# Patient Record
Sex: Female | Born: 1980 | Race: White | Hispanic: No | Marital: Married | Smoking: Current every day smoker
Health system: Southern US, Community
[De-identification: ages and names within clinical notes are randomized; demographics above are authoritative.]

## PROBLEM LIST (undated history)

## (undated) DIAGNOSIS — Z227 Latent tuberculosis: Secondary | ICD-10-CM

## (undated) DIAGNOSIS — K219 Gastro-esophageal reflux disease without esophagitis: Secondary | ICD-10-CM

## (undated) DIAGNOSIS — O009 Unspecified ectopic pregnancy without intrauterine pregnancy: Secondary | ICD-10-CM

## (undated) DIAGNOSIS — E785 Hyperlipidemia, unspecified: Secondary | ICD-10-CM

## (undated) DIAGNOSIS — Z9289 Personal history of other medical treatment: Secondary | ICD-10-CM

## (undated) DIAGNOSIS — Z720 Tobacco use: Secondary | ICD-10-CM

## (undated) DIAGNOSIS — L409 Psoriasis, unspecified: Secondary | ICD-10-CM

## (undated) DIAGNOSIS — I2510 Atherosclerotic heart disease of native coronary artery without angina pectoris: Secondary | ICD-10-CM

## (undated) HISTORY — DX: Hyperlipidemia, unspecified: E78.5

## (undated) HISTORY — DX: Latent tuberculosis: Z22.7

## (undated) HISTORY — DX: Psoriasis, unspecified: L40.9

## (undated) HISTORY — PX: DG GALL BLADDER: HXRAD326

## (undated) HISTORY — PX: CHOLECYSTECTOMY: SHX55

## (undated) HISTORY — DX: Atherosclerotic heart disease of native coronary artery without angina pectoris: I25.10

## (undated) HISTORY — DX: Personal history of other medical treatment: Z92.89

## (undated) HISTORY — PX: DILATION AND CURETTAGE OF UTERUS: SHX78

## (undated) HISTORY — DX: Tobacco use: Z72.0

---

## 2004-06-27 ENCOUNTER — Observation Stay: Payer: Self-pay

## 2004-10-01 ENCOUNTER — Observation Stay: Payer: Self-pay | Admitting: Obstetrics & Gynecology

## 2004-10-07 ENCOUNTER — Inpatient Hospital Stay: Payer: Self-pay | Admitting: Obstetrics & Gynecology

## 2005-04-22 ENCOUNTER — Ambulatory Visit: Payer: Self-pay | Admitting: Family Medicine

## 2005-05-02 ENCOUNTER — Inpatient Hospital Stay: Payer: Self-pay | Admitting: Internal Medicine

## 2006-01-28 ENCOUNTER — Emergency Department: Payer: Self-pay | Admitting: Internal Medicine

## 2006-01-30 ENCOUNTER — Inpatient Hospital Stay: Payer: Self-pay | Admitting: Internal Medicine

## 2006-03-06 ENCOUNTER — Ambulatory Visit: Payer: Self-pay | Admitting: Surgery

## 2008-12-29 ENCOUNTER — Ambulatory Visit: Payer: Self-pay | Admitting: Emergency Medicine

## 2010-02-26 ENCOUNTER — Ambulatory Visit: Payer: Self-pay | Admitting: Obstetrics & Gynecology

## 2010-03-02 ENCOUNTER — Ambulatory Visit: Payer: Self-pay | Admitting: Obstetrics & Gynecology

## 2011-06-02 ENCOUNTER — Ambulatory Visit: Payer: Self-pay | Admitting: Obstetrics & Gynecology

## 2012-11-08 ENCOUNTER — Ambulatory Visit: Payer: Self-pay

## 2015-08-14 ENCOUNTER — Encounter: Payer: Self-pay | Admitting: Family Medicine

## 2015-08-14 ENCOUNTER — Ambulatory Visit (INDEPENDENT_AMBULATORY_CARE_PROVIDER_SITE_OTHER): Payer: BC Managed Care – PPO | Admitting: Family Medicine

## 2015-08-14 VITALS — BP 110/78 | HR 104 | Temp 98.1°F | Resp 16 | Ht 65.0 in | Wt 177.0 lb

## 2015-08-14 DIAGNOSIS — J0100 Acute maxillary sinusitis, unspecified: Secondary | ICD-10-CM | POA: Diagnosis not present

## 2015-08-14 DIAGNOSIS — J40 Bronchitis, not specified as acute or chronic: Secondary | ICD-10-CM | POA: Diagnosis not present

## 2015-08-14 MED ORDER — AMOXICILLIN 500 MG PO CAPS: 500.0000 mg | ORAL_CAPSULE | Freq: Three times a day (TID) | ORAL | Status: DC

## 2015-08-14 MED ORDER — GUAIFENESIN-CODEINE 100-10 MG/5ML PO SYRP: 5.0000 mL | ORAL_SOLUTION | Freq: Three times a day (TID) | ORAL | Status: DC | PRN

## 2015-08-14 NOTE — Progress Notes (Signed)
Name: Morgan Mcdowell   MRN: [id number]    DOB: September 26, 1980   Date:08/14/2015       Progress Note  Subjective  Chief Complaint  Chief Complaint  Patient presents with  . Sinus Problem    coughing and sinus pain in head- no fever     Sinus Problem This is a new problem. The current episode started yesterday. The problem has been gradually worsening since onset. There has been no fever. She is experiencing no pain. Associated symptoms include congestion, coughing, shortness of breath, sinus pressure and a sore throat. Pertinent negatives include no chills, diaphoresis, ear pain, headaches, hoarse voice, neck pain or sneezing. Past treatments include acetaminophen. The treatment provided mild relief.  Cough The current episode started yesterday. The problem has been gradually worsening. The cough is productive of purulent sputum (yellow/brown). Associated symptoms include a sore throat and shortness of breath. Pertinent negatives include no chest pain, chills, ear pain, fever, headaches, heartburn, myalgias, rash, weight loss or wheezing. The symptoms are aggravated by pollens. She has tried OTC cough suppressant for the symptoms. The treatment provided mild relief. There is no history of environmental allergies.    No problem-specific assessment & plan notes found for this encounter.   History reviewed. No pertinent past medical history.  Past Surgical History  Procedure Laterality Date  . Dg gall bladder      Family History  Problem Relation Age of Onset  . Family history unknown: Yes    Social History   Social History  . Marital Status: Married    Spouse Name: N/A  . Number of Children: N/A  . Years of Education: N/A   Occupational History  . Not on file.   Social History Main Topics  . Smoking status: Current Every Day Smoker -- 0.50 packs/day    Types: Cigarettes  . Smokeless tobacco: Never Used  . Alcohol Use: No  . Drug Use: No  . Sexual Activity: Not on file    Other Topics Concern  . Not on file   Social History Narrative  . No narrative on file    No Known Allergies   Review of Systems  Constitutional: Negative for fever, chills, weight loss, malaise/fatigue and diaphoresis.  HENT: Positive for congestion, sinus pressure and sore throat. Negative for ear discharge, ear pain, hoarse voice and sneezing.   Eyes: Negative for blurred vision.  Respiratory: Positive for cough and shortness of breath. Negative for sputum production and wheezing.   Cardiovascular: Negative for chest pain, palpitations and leg swelling.  Gastrointestinal: Negative for heartburn, nausea, abdominal pain, diarrhea, constipation, blood in stool and melena.  Genitourinary: Negative for dysuria, urgency, frequency and hematuria.  Musculoskeletal: Negative for myalgias, back pain, joint pain and neck pain.  Skin: Negative for rash.  Neurological: Negative for dizziness, tingling, sensory change, focal weakness and headaches.  Endo/Heme/Allergies: Negative for environmental allergies and polydipsia. Does not bruise/bleed easily.  Psychiatric/Behavioral: Negative for depression and suicidal ideas. The patient is not nervous/anxious and does not have insomnia.      Objective  Filed Vitals:   08/14/15 1402  BP: 110/78  Pulse: 104  Temp: 98.1 F (36.7 C)  TempSrc: Oral  Resp: 16  Height:  (1.651 m)  Weight: 177 lb (80.287 kg)  SpO2: 98%    Physical Exam  Constitutional: She is well-developed, well-nourished, and in no distress. No distress.  HENT:  Head: Normocephalic and atraumatic.  Right Ear: Tympanic membrane, external ear and ear canal normal.  Left Ear: Tympanic membrane, external ear and ear canal normal.  Nose: Right sinus exhibits maxillary sinus tenderness. Left sinus exhibits maxillary sinus tenderness.  Mouth/Throat: Oropharynx is clear and moist.  Eyes: Conjunctivae and EOM are normal. Pupils are equal, round, and reactive to light. Right  eye exhibits no discharge. Left eye exhibits no discharge.  Neck: Normal range of motion. Neck supple. No JVD present. No thyromegaly present.  Cardiovascular: Normal rate, regular rhythm, normal heart sounds and intact distal pulses.  Exam reveals no gallop and no friction rub.   No murmur heard. Pulmonary/Chest: Effort normal and breath sounds normal.  Abdominal: Soft. Bowel sounds are normal. She exhibits no mass. There is no tenderness. There is no guarding.  Musculoskeletal: Normal range of motion. She exhibits no edema.  Lymphadenopathy:    She has no cervical adenopathy.  Neurological: She is alert. She has normal reflexes.  Skin: Skin is warm and dry. She is not diaphoretic.  Psychiatric: Mood and affect normal.  Nursing note and vitals reviewed.     Assessment & Plan  Problem List Items Addressed This Visit    None    Visit Diagnoses    Acute maxillary sinusitis, recurrence not specified    -  Primary    Relevant Medications    amoxicillin (AMOXIL) 500 MG capsule    guaiFENesin-codeine (ROBITUSSIN AC) 100-10 MG/5ML syrup    Bronchitis        Relevant Medications    amoxicillin (AMOXIL) 500 MG capsule    guaiFENesin-codeine (ROBITUSSIN AC) 100-10 MG/5ML syrup         Dr. Hayden Rasmusseneanna Jackee Glasner [facility]  08/14/2015

## 2015-11-23 ENCOUNTER — Emergency Department
Admission: EM | Admit: 2015-11-23 | Discharge: 2015-11-24 | Disposition: A | Discharge status: Home / Self Care | Payer: BC Managed Care – PPO | Attending: Emergency Medicine | Admitting: Emergency Medicine

## 2015-11-23 ENCOUNTER — Emergency Department: Payer: BC Managed Care – PPO

## 2015-11-23 ENCOUNTER — Encounter: Payer: Self-pay | Admitting: Emergency Medicine

## 2015-11-23 DIAGNOSIS — N938 Other specified abnormal uterine and vaginal bleeding: Secondary | ICD-10-CM | POA: Diagnosis not present

## 2015-11-23 DIAGNOSIS — F17210 Nicotine dependence, cigarettes, uncomplicated: Secondary | ICD-10-CM | POA: Insufficient documentation

## 2015-11-23 DIAGNOSIS — Z3A10 10 weeks gestation of pregnancy: Secondary | ICD-10-CM | POA: Diagnosis not present

## 2015-11-23 DIAGNOSIS — O99331 Smoking (tobacco) complicating pregnancy, first trimester: Secondary | ICD-10-CM | POA: Insufficient documentation

## 2015-11-23 DIAGNOSIS — R1032 Left lower quadrant pain: Secondary | ICD-10-CM | POA: Insufficient documentation

## 2015-11-23 DIAGNOSIS — O26899 Other specified pregnancy related conditions, unspecified trimester: Secondary | ICD-10-CM

## 2015-11-23 DIAGNOSIS — R102 Pelvic and perineal pain: Secondary | ICD-10-CM | POA: Insufficient documentation

## 2015-11-23 DIAGNOSIS — R109 Unspecified abdominal pain: Secondary | ICD-10-CM

## 2015-11-23 DIAGNOSIS — O26891 Other specified pregnancy related conditions, first trimester: Secondary | ICD-10-CM | POA: Diagnosis not present

## 2015-11-23 HISTORY — DX: Unspecified ectopic pregnancy without intrauterine pregnancy: O00.90

## 2015-11-23 LAB — URINALYSIS COMPLETE WITH MICROSCOPIC (ARMC ONLY)
Bacteria, UA: NONE SEEN
Bilirubin Urine: NEGATIVE
GLUCOSE, UA: NEGATIVE mg/dL
Ketones, ur: NEGATIVE mg/dL
LEUKOCYTES UA: NEGATIVE
NITRITE: NEGATIVE
PROTEIN: NEGATIVE mg/dL
SPECIFIC GRAVITY, URINE: 1.003 — AB (ref 1.005–1.030)
pH: 6 (ref 5.0–8.0)

## 2015-11-23 LAB — COMPREHENSIVE METABOLIC PANEL
ALK PHOS: 79 U/L (ref 38–126)
ALT: 27 U/L (ref 14–54)
ANION GAP: 12 (ref 5–15)
AST: 23 U/L (ref 15–41)
Albumin: 4.2 g/dL (ref 3.5–5.0)
BILIRUBIN TOTAL: 0.4 mg/dL (ref 0.3–1.2)
BUN: 6 mg/dL (ref 6–20)
CALCIUM: 9 mg/dL (ref 8.9–10.3)
CO2: 21 mmol/L — ABNORMAL LOW (ref 22–32)
Chloride: 102 mmol/L (ref 101–111)
Creatinine, Ser: 0.5 mg/dL (ref 0.44–1.00)
GFR calc non Af Amer: >60 mL/min (ref >60)
Glucose, Bld: 108 mg/dL — ABNORMAL HIGH (ref 65–99)
Potassium: 3.3 mmol/L — ABNORMAL LOW (ref 3.5–5.1)
Sodium: 135 mmol/L (ref 135–145)
TOTAL PROTEIN: 7.9 g/dL (ref 6.5–8.1)

## 2015-11-23 LAB — CBC
HCT: 38.1 % (ref 35.0–47.0)
HEMOGLOBIN: 13.1 g/dL (ref 12.0–16.0)
MCH: 30.6 pg (ref 26.0–34.0)
MCHC: 34.4 g/dL (ref 32.0–36.0)
MCV: 88.9 fL (ref 80.0–100.0)
Platelets: 330 10*3/uL (ref 150–440)
RBC: 4.29 MIL/uL (ref 3.80–5.20)
RDW: 13.7 % (ref 11.5–14.5)
WBC: 16.5 10*3/uL — AB (ref 3.6–11.0)

## 2015-11-23 LAB — ABO/RH: ABO/RH(D): A POS

## 2015-11-23 LAB — LIPASE, BLOOD: Lipase: 21 U/L (ref 11–51)

## 2015-11-23 LAB — HCG, QUANTITATIVE, PREGNANCY: HCG, BETA CHAIN, QUANT, S: 761 m[IU]/mL — AB (ref <5)

## 2015-11-23 NOTE — ED Notes (Addendum)
Pt c/o LEFT groin pain, reports has hx of tubal pregnancy states "it feels the same." Pt reports pain has been progressively getting worse over the past week. Pt denies nausea, vomiting, or diarrhea. MD at bedside.

## 2015-11-23 NOTE — ED Triage Notes (Signed)
Patient reports left lower quad pain and some light vaginal bleeding times one week that became worse today. Patient reports that she took a home pregnancy test and it was positive. Patient reports she has a history of a ectopic pregnancy and was concerned she may have another one.

## 2015-11-23 NOTE — ED Notes (Signed)
Pt in ultrasound

## 2015-11-23 NOTE — Discharge Instructions (Signed)
As I explained to you your pregnancy is too early and we cannot fully rule out an ectopic pregnancy. If your abdominal pain gets worse, if you feel like you are going to pass out, you need to return to the emergency department immediately for reevaluation. Otherwise you MUST follow-up in 48 hours for repeat lab work to determine if this is a viable pregnancy or an ectopic pregnancy. You can follow up with your doctor, [place] [facility], or with the [facility] clinic that [facility] referring you. If you're unable to get in with any of these clinics you can return to the emergency department for this evaluation.

## 2015-11-23 NOTE — ED Provider Notes (Signed)
[facility] Emergency Department Provider Note  ____________________________________________  Time seen: Approximately 11:10 PM  I have reviewed the triage vital signs and the nursing notes.   HISTORY  Chief Complaint Abdominal Pain and Vaginal Bleeding   HPI Morgan Mcdowell is a 35 y.o. female the history of a prior ectopic pregnancy who presents for evaluation of left abdominal pain. Patient reports a week of dull pain in the left lower quadrant but today became sharp and more intense. She currently endorses moderate pain, constant, nonradiating, located in the left lower quadrant. She took a pregnancy test this morning was positive and she was concerned she had another ectopic pregnancy. Patient's first ectopic was treated with methotrexate successfully. Patient denies any vaginal discharge, urinary symptoms, nausea, vomiting, chest pain, shortness of breath. She does not take any birth control. LMP in the middle of May.  Past Medical History:  Diagnosis Date  . Ectopic pregnancy     There are no active problems to display for this patient.   Past Surgical History:  Procedure Laterality Date  . DG GALL BLADDER      Prior to Admission medications   Medication Sig Start Date End Date Taking? Authorizing Provider  amoxicillin (AMOXIL) 500 MG capsule Take 1 capsule (500 mg total) by mouth 3 (three) times daily. 08/14/15   Duanne Limerick, MD  guaiFENesin-codeine (ROBITUSSIN AC) 100-10 MG/5ML syrup Take 5 mLs by mouth 3 (three) times daily as needed for cough. 08/14/15   Duanne Limerick, MD    Allergies Review of patient's allergies indicates no known allergies.  Family History  Problem Relation Age of Onset  . Family history unknown: Yes    Social History Social History  Substance Use Topics  . Smoking status: Current Every Day Smoker    Packs/day: 0.50    Types: Cigarettes  . Smokeless tobacco: Never Used  . Alcohol use No    Review of  Systems  Constitutional: Negative for fever. Eyes: Negative for visual changes. ENT: Negative for sore throat. Cardiovascular: Negative for chest pain. Respiratory: Negative for shortness of breath. Gastrointestinal: LLQ abdominal pain. No vomiting or diarrhea. Genitourinary: Negative for dysuria. Musculoskeletal: Negative for back pain. Skin: Negative for rash. Neurological: Negative for headaches, weakness or numbness.  ____________________________________________   PHYSICAL EXAM:  VITAL SIGNS: ED Triage Vitals [11/23/15 2050]  Enc Vitals Group     BP 123/74     Pulse Rate 95     Resp 18     Temp 98.5 F (36.9 C)     Temp Source Oral     SpO2 100 %     Weight 170 lb (77.1 kg)     Height  (1.626 m)     Head Circumference      Peak Flow      Pain Score 6     Pain Loc      Pain Edu?      Excl. in GC?     Constitutional: Alert and oriented. Well appearing and in no apparent distress. HEENT:      Head: Normocephalic and atraumatic.         Eyes: Conjunctivae are normal. Sclera is non-icteric. EOMI. PERRL      Mouth/Throat: Mucous membranes are moist.       Neck: Supple with no signs of meningismus. Cardiovascular: Regular rate and rhythm. No murmurs, gallops, or rubs. 2+ symmetrical distal pulses are present in all extremities. No JVD. Respiratory: Normal respiratory effort.  Lungs are clear to auscultation bilaterally. No wheezes, crackles, or rhonchi.  Gastrointestinal: Soft, ttp over the LLQ, and non distended with positive bowel sounds. No rebound or guarding. Genitourinary: No CVA tenderness. Pelvic exam: Normal external genitalia, no rashes or lesions. Normal cervical mucus. Os closed. No cervical motion tenderness.  Left adnexal tenderness to palpation.   Musculoskeletal: Nontender with normal range of motion in all extremities. No edema, cyanosis, or erythema of extremities. Neurologic: Normal speech and language. Face is symmetric. Moving all extremities.  No gross focal neurologic deficits are appreciated. Skin: Skin is warm, dry and intact. No rash noted. Psychiatric: Mood and affect are normal. Speech and behavior are normal.  ____________________________________________   LABS (all labs ordered are listed, but only abnormal results are displayed)  Labs Reviewed  COMPREHENSIVE METABOLIC PANEL - Abnormal; Notable for the following:       Result Value   Potassium 3.3 (*)    CO2 21 (*)    Glucose, Bld 108 (*)    All other components within normal limits  CBC - Abnormal; Notable for the following:    WBC 16.5 (*)    All other components within normal limits  URINALYSIS COMPLETEWITH MICROSCOPIC ([facility] ONLY) - Abnormal; Notable for the following:    Color, Urine STRAW (*)    APPearance CLEAR (*)    Specific Gravity, Urine 1.003 (*)    Hgb urine dipstick 2+ (*)    Squamous Epithelial / LPF 0-5 (*)    All other components within normal limits  HCG, QUANTITATIVE, PREGNANCY - Abnormal; Notable for the following:    hCG, Beta Chain, Quant, S 761 (*)    All other components within normal limits  LIPASE, BLOOD  ABO/RH   ____________________________________________  EKG  none ____________________________________________  RADIOLOGY  TVUS: negative  ____________________________________________   PROCEDURES  Procedure(s) performed: None Procedures Critical Care performed:  None ____________________________________________   INITIAL IMPRESSION / ASSESSMENT AND PLAN / ED COURSE   35 y.o. female the history of a prior ectopic pregnancy who presents for evaluation of left abdominal pain and a positive pregnancy test. Patient hCG is less than 800. Transvaginal ultrasound does not show an IUP or an ectopic pregnancy. She is tender over her left adnexa but does not have a surgical abdomen. Plan is for recheck hCG in 48 hours. I explained to the patient that there is a chance that this could be another ectopic pregnancy and if the pain  is getting worse or she feels that she is going to pass out she needs to return sooner. She will try to follow up with her OB but I am also referring her to the [facility] and our OB on-call. I instructed the patient to return to the emergency department she is unable to get in with any of these providers for further evaluation. Patient is comfortable with this plan and agrees to return. Her wet prep is pending at this time. Care is transferred to Dr. Zenda Alpers.  Pertinent labs & imaging results that were available during my care of the patient were reviewed by me and considered in my medical decision making (see chart for details).    ____________________________________________   FINAL CLINICAL IMPRESSION(S) / ED DIAGNOSES  Final diagnoses:  Pelvic pain in female  Abdominal pain affecting pregnancy      NEW MEDICATIONS STARTED DURING THIS VISIT:  New Prescriptions   No medications on file     Note:  This document was prepared using Dragon voice recognition  software and may include unintentional dictation errors.    Nita Sickle, MD 11/23/15 [phone]

## 2015-11-24 LAB — WET PREP, GENITAL
CLUE CELLS WET PREP: NONE SEEN
Sperm: NONE SEEN
Trich, Wet Prep: NONE SEEN
Yeast Wet Prep HPF POC: NONE SEEN

## 2015-11-24 LAB — CHLAMYDIA/NGC RT PCR (ARMC ONLY)
CHLAMYDIA TR: NOT DETECTED
N gonorrhoeae: NOT DETECTED

## 2015-11-24 NOTE — ED Notes (Signed)

## 2015-11-24 NOTE — ED Provider Notes (Signed)
-----------------------------------------   1:46 AM on 11/24/2015 -----------------------------------------   Blood pressure 120/76, pulse 89, temperature 98.5 F (36.9 C), temperature source Oral, resp. rate 16, height 5\' 4"  (1.626 m), weight 170 lb (77.1 kg), SpO2 99 %.  Assuming care from Dr. Darden Dates.  In short, Morgan Mcdowell is a 35 y.o. female with a chief complaint of Abdominal Pain and Vaginal Bleeding .  Refer to the original H&P for additional details.  The current plan of care is to follow up the result of the wet prep and the ABO/Rh.  The patient's wet prep is negative and the patient's blood type is A+  Since the ultrasound was negative and her wet work is negative the patient will be discharged to home. The patient has been encouraged to return in 2 days or follow-up with her doctor in 2 days for repeat blood work and ultrasound. The patient is also encouraged to return of her pain seems to worsen or her bleeding worsens.    Rebecka Apley, MD 11/24/15 [phone]

## 2015-12-10 ENCOUNTER — Encounter
Admission: RE | Admit: 2015-12-10 | Discharge: 2015-12-10 | Disposition: A | Discharge status: Home / Self Care | Payer: BC Managed Care – PPO | Source: Ambulatory Visit | Attending: Obstetrics and Gynecology | Admitting: Obstetrics and Gynecology

## 2015-12-10 HISTORY — DX: Gastro-esophageal reflux disease without esophagitis: K21.9

## 2015-12-10 NOTE — Patient Instructions (Signed)
  Your procedure is scheduled on: 12-11-15 Report to Same Day Surgery 2nd floor medical mall To find out your arrival time please call [phone] between 1PM - 3PM on 12-10-15  Remember: Instructions that are not followed completely may result in serious medical risk, up to and including death, or upon the discretion of your surgeon and anesthesiologist your surgery may need to be rescheduled.    _x___ 1. Do not eat food or drink liquids after midnight. No gum chewing or hard candies.     __x__ 2. No Alcohol for 24 hours before or after surgery.   __x__3. No Smoking for 24 prior to surgery.   ____  4. Bring all medications with you on the day of surgery if instructed.    __x__ 5. Notify your doctor if there is any change in your medical condition     (cold, fever, infections).     Do not wear jewelry, make-up, hairpins, clips or nail polish.  Do not wear lotions, powders, or perfumes. You may wear deodorant.  Do not shave 48 hours prior to surgery. Men may shave face and neck.  Do not bring valuables to the hospital.    [facility] belongings or valuables.               Contacts, dentures or bridgework may not be worn into surgery.  Leave your suitcase in the car. After surgery it may be brought to your room.  For patients admitted to the hospital, discharge time is determined by your treatment team.   Patients discharged the day of surgery will not be allowed to drive home.    Please read over the following fact sheets that you were given:   [facility] Preparing for [facility] and or MRSA Information   ____ Take these medicines the morning of surgery with A SIP OF WATER:    1. NONE  2.  3.  4.  5.  6.  ____ Fleet Enema (as directed)   ____ Use CHG Soap or sage wipes as directed on instruction sheet   ____ Use inhalers on the day of surgery and bring to hospital day of surgery  ____ Stop metformin 2 days prior to surgery    ____ Take 1/2 of  usual insulin dose the night before surgery and none on the morning of surgery.   ____ Stop aspirin or coumadin, or plavix  _x__ Stop Anti-inflammatories such as Advil, Aleve, Ibuprofen, Motrin, Naproxen,          Naprosyn, Goodies powders or aspirin products. Ok to take Tylenol.   ____ Stop supplements until after surgery.    ____ Bring C-Pap to the hospital.

## 2015-12-11 ENCOUNTER — Encounter: Payer: Self-pay | Admitting: *Deleted

## 2015-12-11 ENCOUNTER — Ambulatory Visit: Payer: BC Managed Care – PPO | Admitting: Anesthesiology

## 2015-12-11 ENCOUNTER — Ambulatory Visit
Admission: RE | Admit: 2015-12-11 | Discharge: 2015-12-11 | Disposition: A | Discharge status: Home / Self Care | Payer: BC Managed Care – PPO | Source: Ambulatory Visit | Attending: Obstetrics and Gynecology | Admitting: Obstetrics and Gynecology

## 2015-12-11 ENCOUNTER — Encounter
Admission: RE | Disposition: A | Discharge status: Home / Self Care | Payer: Self-pay | Source: Ambulatory Visit | Attending: Obstetrics and Gynecology

## 2015-12-11 DIAGNOSIS — Z9889 Other specified postprocedural states: Secondary | ICD-10-CM | POA: Diagnosis not present

## 2015-12-11 DIAGNOSIS — O001 Tubal pregnancy without intrauterine pregnancy: Secondary | ICD-10-CM | POA: Insufficient documentation

## 2015-12-11 DIAGNOSIS — O26899 Other specified pregnancy related conditions, unspecified trimester: Secondary | ICD-10-CM | POA: Insufficient documentation

## 2015-12-11 DIAGNOSIS — K219 Gastro-esophageal reflux disease without esophagitis: Secondary | ICD-10-CM | POA: Diagnosis not present

## 2015-12-11 DIAGNOSIS — Z9049 Acquired absence of other specified parts of digestive tract: Secondary | ICD-10-CM | POA: Diagnosis not present

## 2015-12-11 DIAGNOSIS — O99330 Smoking (tobacco) complicating pregnancy, unspecified trimester: Secondary | ICD-10-CM | POA: Diagnosis not present

## 2015-12-11 DIAGNOSIS — N838 Other noninflammatory disorders of ovary, fallopian tube and broad ligament: Secondary | ICD-10-CM | POA: Diagnosis not present

## 2015-12-11 DIAGNOSIS — O00109 Unspecified tubal pregnancy without intrauterine pregnancy: Secondary | ICD-10-CM | POA: Diagnosis present

## 2015-12-11 DIAGNOSIS — Z302 Encounter for sterilization: Secondary | ICD-10-CM

## 2015-12-11 DIAGNOSIS — O009 Unspecified ectopic pregnancy without intrauterine pregnancy: Secondary | ICD-10-CM | POA: Diagnosis present

## 2015-12-11 HISTORY — PX: DIAGNOSTIC LAPAROSCOPY WITH REMOVAL OF ECTOPIC PREGNANCY: SHX6449

## 2015-12-11 HISTORY — PX: LAPAROSCOPIC BILATERAL SALPINGECTOMY: SHX5889

## 2015-12-11 LAB — CBC
HCT: 36.7 % (ref 35.0–47.0)
HEMOGLOBIN: 12.6 g/dL (ref 12.0–16.0)
MCH: 30.9 pg (ref 26.0–34.0)
MCHC: 34.4 g/dL (ref 32.0–36.0)
MCV: 89.7 fL (ref 80.0–100.0)
PLATELETS: 344 10*3/uL (ref 150–440)
RBC: 4.09 MIL/uL (ref 3.80–5.20)
RDW: 14 % (ref 11.5–14.5)
WBC: 11.6 10*3/uL — AB (ref 3.6–11.0)

## 2015-12-11 LAB — COMPREHENSIVE METABOLIC PANEL
ALK PHOS: 75 U/L (ref 38–126)
ALT: 19 U/L (ref 14–54)
AST: 17 U/L (ref 15–41)
Albumin: 4.1 g/dL (ref 3.5–5.0)
Anion gap: 5 (ref 5–15)
BUN: 8 mg/dL (ref 6–20)
CALCIUM: 9 mg/dL (ref 8.9–10.3)
CHLORIDE: 109 mmol/L (ref 101–111)
CO2: 24 mmol/L (ref 22–32)
CREATININE: 0.44 mg/dL (ref 0.44–1.00)
GFR calc Af Amer: >60 mL/min (ref >60)
Glucose, Bld: 95 mg/dL (ref 65–99)
Potassium: 3.8 mmol/L (ref 3.5–5.1)
SODIUM: 138 mmol/L (ref 135–145)
Total Bilirubin: 0.4 mg/dL (ref 0.3–1.2)
Total Protein: 7.7 g/dL (ref 6.5–8.1)

## 2015-12-11 LAB — TYPE AND SCREEN
ABO/RH(D): A POS
Antibody Screen: NEGATIVE

## 2015-12-11 LAB — HCG, QUANTITATIVE, PREGNANCY: hCG, Beta Chain, Quant, S: 2317 m[IU]/mL — ABNORMAL HIGH (ref <5)

## 2015-12-11 SURGERY — SALPINGECTOMY, BILATERAL, LAPAROSCOPIC
Anesthesia: General | Wound class: Clean Contaminated

## 2015-12-11 MED ORDER — LACTATED RINGERS IV SOLN
INTRAVENOUS | Status: DC
  Administered 2015-12-11: 125 mL/h via INTRAVENOUS

## 2015-12-11 MED ORDER — FAMOTIDINE 20 MG PO TABS
20.0000 mg | ORAL_TABLET | Freq: Once | ORAL | Status: AC
  Administered 2015-12-11: 20 mg via ORAL

## 2015-12-11 MED ORDER — GLYCOPYRROLATE 0.2 MG/ML IJ SOLN
INTRAMUSCULAR | Status: DC | PRN
  Administered 2015-12-11: 0.2 mg via INTRAVENOUS

## 2015-12-11 MED ORDER — ROCURONIUM BROMIDE 100 MG/10ML IV SOLN
INTRAVENOUS | Status: DC | PRN
  Administered 2015-12-11: 15 mg via INTRAVENOUS
  Administered 2015-12-11: 5 mg via INTRAVENOUS

## 2015-12-11 MED ORDER — ONDANSETRON HCL 4 MG/2ML IJ SOLN: 4.0000 mg | Freq: Once | INTRAMUSCULAR | Status: DC | PRN

## 2015-12-11 MED ORDER — DEXAMETHASONE SODIUM PHOSPHATE 10 MG/ML IJ SOLN
INTRAMUSCULAR | Status: DC | PRN
  Administered 2015-12-11: 10 mg via INTRAVENOUS

## 2015-12-11 MED ORDER — PHENYLEPHRINE HCL 10 MG/ML IJ SOLN
INTRAMUSCULAR | Status: DC | PRN
  Administered 2015-12-11 (×4): 100 ug via INTRAVENOUS

## 2015-12-11 MED ORDER — BUPIVACAINE HCL (PF) 0.5 % IJ SOLN
INTRAMUSCULAR | Status: AC
  Filled 2015-12-11: qty 30

## 2015-12-11 MED ORDER — OXYCODONE-ACETAMINOPHEN 5-325 MG PO TABS: 1.0000 | ORAL_TABLET | Freq: Four times a day (QID) | ORAL | 0 refills | Status: DC | PRN

## 2015-12-11 MED ORDER — IBUPROFEN 600 MG PO TABS: 600.0000 mg | ORAL_TABLET | Freq: Four times a day (QID) | ORAL | 0 refills | Status: DC | PRN

## 2015-12-11 MED ORDER — PROPOFOL 10 MG/ML IV BOLUS
INTRAVENOUS | Status: DC | PRN
  Administered 2015-12-11: 150 mg via INTRAVENOUS

## 2015-12-11 MED ORDER — MIDAZOLAM HCL 2 MG/2ML IJ SOLN
INTRAMUSCULAR | Status: DC | PRN
  Administered 2015-12-11: 2 mg via INTRAVENOUS

## 2015-12-11 MED ORDER — ACETAMINOPHEN 10 MG/ML IV SOLN
INTRAVENOUS | Status: DC | PRN
  Administered 2015-12-11: 1000 mg via INTRAVENOUS

## 2015-12-11 MED ORDER — LACTATED RINGERS IV SOLN: INTRAVENOUS | Status: DC

## 2015-12-11 MED ORDER — OXYCODONE-ACETAMINOPHEN 5-325 MG PO TABS
ORAL_TABLET | ORAL | Status: AC
  Administered 2015-12-11: 1 via ORAL
  Filled 2015-12-11: qty 1

## 2015-12-11 MED ORDER — SUCCINYLCHOLINE CHLORIDE 20 MG/ML IJ SOLN
INTRAMUSCULAR | Status: DC | PRN
  Administered 2015-12-11: 120 mg via INTRAVENOUS

## 2015-12-11 MED ORDER — KETOROLAC TROMETHAMINE 30 MG/ML IJ SOLN
INTRAMUSCULAR | Status: DC | PRN
  Administered 2015-12-11: 30 mg via INTRAVENOUS

## 2015-12-11 MED ORDER — FENTANYL CITRATE (PF) 100 MCG/2ML IJ SOLN
INTRAMUSCULAR | Status: DC | PRN
  Administered 2015-12-11 (×2): 50 ug via INTRAVENOUS

## 2015-12-11 MED ORDER — FAMOTIDINE 20 MG PO TABS
ORAL_TABLET | ORAL | Status: AC
  Administered 2015-12-11: 20 mg via ORAL
  Filled 2015-12-11: qty 1

## 2015-12-11 MED ORDER — SUGAMMADEX SODIUM 200 MG/2ML IV SOLN
INTRAVENOUS | Status: DC | PRN
  Administered 2015-12-11: 175 mg via INTRAVENOUS

## 2015-12-11 MED ORDER — LIDOCAINE HCL (CARDIAC) 20 MG/ML IV SOLN
INTRAVENOUS | Status: DC | PRN
  Administered 2015-12-11: 50 mg via INTRAVENOUS

## 2015-12-11 MED ORDER — FENTANYL CITRATE (PF) 100 MCG/2ML IJ SOLN
INTRAMUSCULAR | Status: AC
  Administered 2015-12-11: 25 ug via INTRAVENOUS
  Filled 2015-12-11: qty 2

## 2015-12-11 MED ORDER — BUPIVACAINE HCL (PF) 0.5 % IJ SOLN
INTRAMUSCULAR | Status: DC | PRN
  Administered 2015-12-11: 5 mL

## 2015-12-11 MED ORDER — FENTANYL CITRATE (PF) 100 MCG/2ML IJ SOLN
25.0000 ug | INTRAMUSCULAR | Status: DC | PRN
  Administered 2015-12-11 (×4): 25 ug via INTRAVENOUS

## 2015-12-11 MED ORDER — ONDANSETRON HCL 4 MG/2ML IJ SOLN
INTRAMUSCULAR | Status: DC | PRN
  Administered 2015-12-11: 4 mg via INTRAVENOUS

## 2015-12-11 MED ORDER — ACETAMINOPHEN 10 MG/ML IV SOLN
INTRAVENOUS | Status: AC
  Filled 2015-12-11: qty 100

## 2015-12-11 SURGICAL SUPPLY — 43 items
BAG URO DRAIN 2000ML W/SPOUT (MISCELLANEOUS) ×3 IMPLANT
BLADE SURG SZ11 CARB STEEL (BLADE) ×3 IMPLANT
CANISTER SUCT 1200ML W/VALVE (MISCELLANEOUS) ×3 IMPLANT
CATH FOL 2WAY LX 16X5 (CATHETERS) ×3 IMPLANT
CATH ROBINSON RED A/P 16FR (CATHETERS) IMPLANT
CHLORAPREP W/TINT 26ML (MISCELLANEOUS) ×3 IMPLANT
DRAPE LEGGINS SURG 28X43 STRL (DRAPES) ×3 IMPLANT
DRAPE UNDER BUTTOCK W/FLU (DRAPES) ×3 IMPLANT
GLOVE BIO SURGEON STRL SZ7 (GLOVE) ×9 IMPLANT
GLOVE BIOGEL PI IND STRL 7.5 (GLOVE) ×6 IMPLANT
GLOVE BIOGEL PI INDICATOR 7.5 (GLOVE) ×3
GOWN STRL REUS W/ TWL LRG LVL3 (GOWN DISPOSABLE) ×6 IMPLANT
GOWN STRL REUS W/TWL LRG LVL3 (GOWN DISPOSABLE) ×3
GRASPER SUT TROCAR 14GX15 (MISCELLANEOUS) ×3 IMPLANT
IRRIGATION STRYKERFLOW (MISCELLANEOUS) IMPLANT
IRRIGATOR STRYKERFLOW (MISCELLANEOUS)
IV LACTATED RINGERS 1000ML (IV SOLUTION) ×3 IMPLANT
KIT RM TURNOVER CYSTO AR (KITS) ×3 IMPLANT
LABEL OR SOLS (LABEL) ×3 IMPLANT
LIGASURE BLUNT 5MM 37CM (INSTRUMENTS) ×3 IMPLANT
LIQUID BAND (GAUZE/BANDAGES/DRESSINGS) ×3 IMPLANT
NEEDLE HYPO 25GX1X1/2 BEV (NEEDLE) ×3 IMPLANT
NEEDLE HYPO 25X1 1.5 SAFETY (NEEDLE) ×3 IMPLANT
NS IRRIG 500ML POUR BTL (IV SOLUTION) ×3 IMPLANT
PACK LAP CHOLECYSTECTOMY (MISCELLANEOUS) ×3 IMPLANT
PAD OB MATERNITY 4.3X12.25 (PERSONAL CARE ITEMS) ×3 IMPLANT
PAD PREP 24X41 OB/GYN DISP (PERSONAL CARE ITEMS) ×3 IMPLANT
POUCH ENDO CATCH 10MM SPEC (MISCELLANEOUS) ×3 IMPLANT
SCISSORS METZENBAUM CVD 33 (INSTRUMENTS) ×3 IMPLANT
SHEARS HARMONIC ACE PLUS 36CM (ENDOMECHANICALS) IMPLANT
SLEEVE ENDOPATH XCEL 5M (ENDOMECHANICALS) ×3 IMPLANT
SOL PREP PVP 2OZ (MISCELLANEOUS) ×3
SOLUTION PREP PVP 2OZ (MISCELLANEOUS) ×2 IMPLANT
SURGILUBE 2OZ TUBE FLIPTOP (MISCELLANEOUS) ×3 IMPLANT
SUT MNCRL 3-0 UNDYED SH (SUTURE) ×2 IMPLANT
SUT MONOCRYL 3-0 UNDYED (SUTURE) ×1
SUT VIC AB 0 CT1 36 (SUTURE) ×3 IMPLANT
SUT VIC AB 2-0 UR6 27 (SUTURE) ×3 IMPLANT
SUT VIC AB 4-0 PS2 18 (SUTURE) ×6 IMPLANT
SYR 50ML LL SCALE MARK (SYRINGE) ×3 IMPLANT
TROCAR ENDO BLADELESS 11MM (ENDOMECHANICALS) ×3 IMPLANT
TROCAR XCEL NON-BLD 5MMX100MML (ENDOMECHANICALS) ×3 IMPLANT
TUBING INSUFFLATOR HI FLOW (MISCELLANEOUS) ×3 IMPLANT

## 2015-12-11 NOTE — Transfer of Care (Signed)
Immediate Anesthesia Transfer of Care Note  Patient: Morgan Mcdowell  Procedure(s) Performed: Procedure(s): LAPAROSCOPIC BILATERAL SALPINGECTOMY (Bilateral) DIAGNOSTIC LAPAROSCOPY WITH REMOVAL OF ECTOPIC PREGNANCY (N/A)  Patient Location: PACU  Anesthesia Type:General  Level of Consciousness: sedated  Airway & Oxygen Therapy: Patient Spontanous Breathing and Patient connected to face mask oxygen  Post-op Assessment: Report given to RN and Post -op Vital signs reviewed and stable  Post vital signs: Reviewed and stable  Last Vitals:  Vitals:   12/11/15 1053 12/11/15 1254  BP: 120/66   Pulse: 84 78  Resp: 16 14  Temp: 36.9 C 37.1 C    Last Pain:  Vitals:   12/11/15 1254  TempSrc: Tympanic         Complications: No apparent anesthesia complications

## 2015-12-11 NOTE — Discharge Instructions (Signed)

## 2015-12-11 NOTE — H&P (Signed)
GYNECOLOGY ADMISSION HISTORY AND PHYSICAL NOTE    Attending Provider: Conard NovakJackson, Luv Mish D, MD   Morgan Jubangela M Simonis [phone] 12/11/2015 11:21 AM    Chief Complaint:  Ectopic pregnancy and desires permanent sterility.  History of Present Ilness:   Morgan Jubangela M Achorn is a 35 y.o. multiparous female who presents for surgery for ectopic pregnancy. Her pregnancy has been monitored with quantitative hCG levels, which did not arrive appropriately.  She has had episodes of abdominal pain and vaginal bleeding, but none severe.  She was treated with methotrexate for .   Presumed ectopic pregnancy and did not respond to methotrexate.  She has a history of an ectopic pregnancy successfully treated with methotrexate.  She desires permanent sterility.   Past Medical History:  Diagnosis Date  . Ectopic pregnancy   . GERD (gastroesophageal reflux disease)    OCC-TUMS PRN   Past Surgical History:  Procedure Laterality Date  . CHOLECYSTECTOMY    . DG GALL BLADDER    . DILATION AND CURETTAGE OF UTERUS     Allergies: NKDA  Prior to Admission medications: none    Social History:  She  reports that she has been smoking Cigarettes.  She has a 7.50 pack-year smoking history. She has never used smokeless tobacco. She reports that she does not drink alcohol or use drugs.  Family History:  Reviewed and no family significant family history.   Review of Systems:  Negative x 10 systems reviewed except as noted in the HPI.    Objective    BP 120/66   Pulse 84   Temp 98.4 F (36.9 C) (Oral)   Resp 16   Ht 5\' 4"  (1.626 m)   Wt 77.1 kg (170 lb)   LMP 10/07/2015 (Approximate) Comment: ectopic pregnancy  SpO2 99%   BMI 29.18 kg/m  Physical Exam  General:  She is a well appearing female in no distress.  HEENT:  Normocephalic, atraumatic.   Neck:  supple, no lymphadenopathy Cardiac:  RRR Pulmonary:  Clear to auscultation bilaterally. No wheezes, rales, rhonchi.   Abdomen:  Soft, non-tender,  non-distended, +BS. No rebound or guarding.  Extremities:  Non-tender, symmetric no edema bilaterally.   Neurologic:  Alert & oriented x 3.  Appropriate, conversant.    Laboratory Results:   Lab Results  Component Value Date   WBC 16.5 (H) 11/23/2015   RBC 4.29 11/23/2015   HGB 13.1 11/23/2015   HCT 38.1 11/23/2015   PLT 330 11/23/2015   NA 135 11/23/2015   K 3.3 (L) 11/23/2015   CREATININE 0.50 11/23/2015   No results found for: PREGTESTUR, PREGSERUM, HCG, HCGQUANT  Assessment & Plan   Morgan Mcdowell is a 35 y.o. multiparous female with an ectopic pregnancy of unknown anatomic location and desires permanent sterility.  Discussed laparoscopy to located the pregnancy.  If seen and obvious, will remove pregnancy and both fallopian tubes. If no obvious ectopic seen, will remove both fallopian tubes and perform a dilation and curettage.  Again, this is not a normal pregnancy and she has never had an appropriate rise in hCG levels and has not demonstrated a pregnancy anywhere on ultrasound.  Plan:  1.  To OR for above 2.  Consent reviewed and discussed with me and patient agrees to proceed as outlined above.  Conard NovakJackson, Miriah Maruyama D, MD 12/11/2015 11:21 AM

## 2015-12-11 NOTE — Anesthesia Postprocedure Evaluation (Signed)
Anesthesia Post Note  Patient: Morgan Mcdowell  Procedure(s) Performed: Procedure(s) (LRB): LAPAROSCOPIC BILATERAL SALPINGECTOMY (Bilateral) DIAGNOSTIC LAPAROSCOPY WITH REMOVAL OF ECTOPIC PREGNANCY (N/A)  Patient location during evaluation: PACU Anesthesia Type: General Level of consciousness: awake and alert Pain management: pain level controlled Vital Signs Assessment: post-procedure vital signs reviewed and stable Respiratory status: spontaneous breathing, nonlabored ventilation, respiratory function stable and patient connected to nasal cannula oxygen Cardiovascular status: blood pressure returned to baseline and stable Postop Assessment: no signs of nausea or vomiting Anesthetic complications: no    Last Vitals:  Vitals:   12/11/15 1404 12/11/15 1429  BP: 113/65 92/63  Pulse: 67 63  Resp: 16 14  Temp: 36.1 C     Last Pain:  Vitals:   12/11/15 1429  TempSrc:   PainSc: 4                  Caydee Talkington S

## 2015-12-11 NOTE — Anesthesia Procedure Notes (Signed)
Procedure Name: Intubation Date/Time: 12/11/2015 11:38 AM Performed by: Ginger CarneMICHELET, Julissa Browning Pre-anesthesia Checklist: Patient identified, Emergency Drugs available, Suction available, Patient being monitored and Timeout performed Patient Re-evaluated:Patient Re-evaluated prior to inductionOxygen Delivery Method: Circle system utilized Preoxygenation: Pre-oxygenation with 100% oxygen Intubation Type: IV induction Ventilation: Mask ventilation without difficulty Laryngoscope Size: Miller and 2 Grade View: Grade I Tube type: Oral Tube size: 7.0 mm Number of attempts: 1 Airway Equipment and Method: Stylet Placement Confirmation: ETT inserted through vocal cords under direct vision,  positive ETCO2 and breath sounds checked- equal and bilateral Secured at: 20 cm Tube secured with: Tape Dental Injury: Teeth and Oropharynx as per pre-operative assessment

## 2015-12-11 NOTE — Op Note (Signed)
Operative Note    Pre-Op Diagnosis:  1) ectopic pregnancy of unknown anatomic location 2) multiparity, desires permanent sterilization  Post-Op Diagnosis:  1) ectopic pregnancy in right fallopian tube 2) multiparity, desires permanent sterilization  Procedures:  1. Operative laparoscopy 2. Bilateral salpingectomy 3. Removal of ectopic pregnancy  Primary Surgeon: Thomasene MohairStephen Annaya Bangert, MD   EBL: 20 mL   IVF: 800 mL   Urine output: 150 mL clear urine at end of case  Specimens:  1) right fallopian tube with ectopic pregnancy 2) left fallopian tube  Drains: None  Complications: None   Disposition: PACU   Condition: Stable   Findings:  1) ectopic pregnancy in right fallopian tube 2) normal appearing uterus, left fallopian tube, and bilateral ovaries  Procedure Summary:  The patient was taken to the operating room where general anesthesia was administered and found to be adequate. She was placed in the dorsal supine lithotomy position in [place] stirrups and prepped and draped in usual sterile fashion. After a timeout was called an indwelling catheter was placed in her bladder.   Attention was turned to the abdomen where after injection of local anesthetic, a 5 mm infraumbilical incision was made with the scalpel. Entry into the abdomen was obtained via Optiview trocar technique (a blunt entry technique with camera visualization through the obturator upon entry). Verification of entry into the abdomen was obtained using opening pressures. The abdomen was insufflated with CO2. The camera was introduced through the trocar with verification of atraumatic entry.  A left lower quadrant port 5mm was placed under direct intraabdominal camera visualization without difficulty. Similarly an 11 mm suprapubic trocar was placed under direct intraabdominal camera visualization without difficulty.  A survey of the pelvis was taken with the above-noted findings.  The fimbriated end of the right  fallopian tube was elevated and the 5mm LigaSure device was utilized to perform the right salpingectomy in a lateral-to-medial fashion along the mesosalpinx.  This technique allowed for including the removal of the fallopian tube with the ectopic pregnancy contained therein.  In a similar fashion, the left fallopian tube was visualized and from a medial to lateral fashion the left fallopian tube was removed along the mesosalpinx without difficulty. The right fallopian tube with the ectopic pregnancy was removed through an EndoCatch back intact.  The left fallopian tube was removed through the trocar intact without difficulty.  The pedicle lines were inspected and were hemostatic.  Prior to the salpingectomy, both ureters were identified and found to be well away from the operative area of interest. The suprapubic port was closed using 2-0 Vicryl with the Carter-Thomson device.  The abdomen was desufflated and five deep breaths were given by anesthesia.  All trocars were removed. The 5mm port sites were re-approximated using 4-0 vicryl in a subcuticular horizontal mattress closure.  The 11 mm port site was closed using a running 4-0 vicryl subcuticular closure.  All port sites were covered with surgical skin glue.  Prior to skin incision at each site 0.5% sensorcaine plain was used for local anesthetic.  The Foley catheter was removed from the bladder and a vaginal sweep revealed the vagina to be free of instruments and sponges.   The patient tolerated the procedure well.  Sponge, lap, needle, and instrument counts were correct x 2.  VTE prophylaxis: SCDs. Antibiotic prophylaxis: none indicated nor given. She was awakened in the operating room and was taken to the PACU in stable condition.   Thomasene MohairStephen Chrisanne Loose, MD 12/11/2015 12:46 PM

## 2015-12-11 NOTE — Anesthesia Preprocedure Evaluation (Signed)
Anesthesia Evaluation  Patient identified by MRN, date of birth, ID band Patient awake    Reviewed: Allergy & Precautions, NPO status , Patient's Chart, lab work & pertinent test results, reviewed documented beta blocker date and time   Airway Mallampati: II  TM Distance: >3 FB     Dental  (+) Chipped   Pulmonary Current Smoker,           Cardiovascular      Neuro/Psych    GI/Hepatic GERD  Controlled,  Endo/Other    Renal/GU      Musculoskeletal   Abdominal   Peds  Hematology   Anesthesia Other Findings   Reproductive/Obstetrics                             Anesthesia Physical Anesthesia Plan  ASA: II  Anesthesia Plan: General   Post-op Pain Management:    Induction: Intravenous  Airway Management Planned: Oral ETT  Additional Equipment:   Intra-op Plan:   Post-operative Plan:   Informed Consent: I have reviewed the patients History and Physical, chart, labs and discussed the procedure including the risks, benefits and alternatives for the proposed anesthesia with the patient or authorized representative who has indicated his/her understanding and acceptance.     Plan Discussed with: CRNA  Anesthesia Plan Comments:         Anesthesia Quick Evaluation

## 2015-12-14 LAB — SURGICAL PATHOLOGY

## 2016-07-03 ENCOUNTER — Ambulatory Visit
Admission: EM | Admit: 2016-07-03 | Discharge: 2016-07-03 | Disposition: A | Discharge status: Home / Self Care | Payer: BC Managed Care – PPO | Attending: Family Medicine | Admitting: Family Medicine

## 2016-07-03 ENCOUNTER — Encounter: Payer: Self-pay | Admitting: Emergency Medicine

## 2016-07-03 DIAGNOSIS — J0100 Acute maxillary sinusitis, unspecified: Secondary | ICD-10-CM

## 2016-07-03 DIAGNOSIS — R05 Cough: Secondary | ICD-10-CM

## 2016-07-03 DIAGNOSIS — R059 Cough, unspecified: Secondary | ICD-10-CM

## 2016-07-03 MED ORDER — HYDROCOD POLST-CPM POLST ER 10-8 MG/5ML PO SUER: 5.0000 mL | Freq: Two times a day (BID) | ORAL | 0 refills | Status: DC | PRN

## 2016-07-03 MED ORDER — AZITHROMYCIN 250 MG PO TABS: ORAL_TABLET | ORAL | 0 refills | Status: DC

## 2016-07-03 NOTE — ED Provider Notes (Signed)
[facility]    CSN: [id number] Arrival date & time: 07/03/16  1228     History   Chief Complaint Chief Complaint  Patient presents with  . Facial Pain  . Nasal Congestion    HPI Morgan Mcdowell is a 36 y.o. female.   The history is provided by the patient.  URI  Presenting symptoms: congestion, cough, facial pain, fatigue, fever and rhinorrhea   Severity:  Moderate Onset quality:  Sudden Timing:  Constant Progression:  Worsening Chronicity:  New Relieved by:  None tried Ineffective treatments:  None tried Associated symptoms: headaches and sinus pain   Risk factors: sick contacts   Risk factors: not elderly, no chronic cardiac disease, no chronic kidney disease, no diabetes mellitus (chronic smoker), no immunosuppression, no recent illness and no recent travel     Past Medical History:  Diagnosis Date  . Ectopic pregnancy   . GERD (gastroesophageal reflux disease)    OCC-TUMS PRN    Patient Active Problem List   Diagnosis Date Noted  . Ectopic pregnancy, tubal 12/11/2015  . Encounter for sterilization 12/11/2015    Past Surgical History:  Procedure Laterality Date  . CHOLECYSTECTOMY    . DG GALL BLADDER    . DIAGNOSTIC LAPAROSCOPY WITH REMOVAL OF ECTOPIC PREGNANCY N/A 12/11/2015   Procedure: DIAGNOSTIC LAPAROSCOPY WITH REMOVAL OF ECTOPIC PREGNANCY;  Surgeon: Conard Novak, MD;  Location: [facility];  Service: Gynecology;  Laterality: N/A;  . DILATION AND CURETTAGE OF UTERUS    . LAPAROSCOPIC BILATERAL SALPINGECTOMY Bilateral 12/11/2015   Procedure: LAPAROSCOPIC BILATERAL SALPINGECTOMY;  Surgeon: Conard Novak, MD;  Location: [facility];  Service: Gynecology;  Laterality: Bilateral;    OB History    No data available       Home Medications    Prior to Admission medications   Medication Sig Start Date End Date Taking? Authorizing Provider  azithromycin (ZITHROMAX Z-PAK) 250 MG tablet 2 tabs po once day 1, then 1 tab po qd for next 4  days 07/03/16   Payton Mccallum, MD  chlorpheniramine-HYDROcodone [facility]) 10-8 MG/5ML SUER Take 5 mLs by mouth every 12 (twelve) hours as needed. 07/03/16   Payton Mccallum, MD    Family History Family History  Problem Relation Age of Onset  . Family history unknown: Yes    Social History Social History  Substance Use Topics  . Smoking status: Current Every Day Smoker    Packs/day: 0.50    Years: 15.00    Types: Cigarettes  . Smokeless tobacco: Never Used  . Alcohol use No     Allergies   Patient has no known allergies.   Review of Systems Review of Systems  Constitutional: Positive for fatigue and fever.  HENT: Positive for congestion, rhinorrhea and sinus pain.   Respiratory: Positive for cough.   Neurological: Positive for headaches.     Physical Exam Triage Vital Signs ED Triage Vitals  Enc Vitals Group     BP 07/03/16 1326 115/81     Pulse Rate 07/03/16 1326 88     Resp 07/03/16 1326 16     Temp 07/03/16 1326 97.5 F (36.4 C)     Temp Source 07/03/16 1326 Oral     SpO2 07/03/16 1326 100 %     Weight 07/03/16 1324 170 lb (77.1 kg)     Height 07/03/16 1324 5\' 5"  (1.651 m)     Head Circumference --      Peak Flow --  Pain Score 07/03/16 1325 2     Pain Loc --      Pain Edu? --      Excl. in GC? --    No data found.   Updated Vital Signs BP 115/81 (BP Location: Left Arm)   Pulse 88   Temp 97.5 F (36.4 C) (Oral)   Resp 16   Ht 5\' 5"  (1.651 m)   Wt 170 lb (77.1 kg)   LMP 06/10/2016 (Exact Date)   SpO2 100%   BMI 28.29 kg/m   Visual Acuity Right Eye Distance:   Left Eye Distance:   Bilateral Distance:    Right Eye Near:   Left Eye Near:    Bilateral Near:     Physical Exam  Constitutional: She appears well-developed and well-nourished. No distress.  HENT:  Head: Normocephalic and atraumatic.  Right Ear: Tympanic membrane, external ear and ear canal normal.  Left Ear: Tympanic membrane, external ear and ear canal  normal.  Nose: Rhinorrhea present. No mucosal edema, nose lacerations, sinus tenderness, nasal deformity, septal deviation or nasal septal hematoma. No epistaxis.  No foreign bodies. Right sinus exhibits maxillary sinus tenderness. Right sinus exhibits no frontal sinus tenderness. Left sinus exhibits maxillary sinus tenderness. Left sinus exhibits no frontal sinus tenderness.  Mouth/Throat: Uvula is midline, oropharynx is clear and moist and mucous membranes are normal. No oropharyngeal exudate.  Eyes: Conjunctivae and EOM are normal. Pupils are equal, round, and reactive to light. Right eye exhibits no discharge. Left eye exhibits no discharge. No scleral icterus.  Neck: Normal range of motion. Neck supple. No thyromegaly present.  Cardiovascular: Normal rate, regular rhythm and normal heart sounds.   Pulmonary/Chest: Effort normal and breath sounds normal. No respiratory distress. She has no wheezes. She has no rales.  Lymphadenopathy:    She has no cervical adenopathy.  Skin: She is not diaphoretic.  Nursing note and vitals reviewed.    UC Treatments / Results  Labs (all labs ordered are listed, but only abnormal results are displayed) Labs Reviewed - No data to display  EKG  EKG Interpretation None       Radiology No results found.  Procedures Procedures (including critical care time)  Medications Ordered in UC Medications - No data to display   Initial Impression / Assessment and Plan / UC Course  I have reviewed the triage vital signs and the nursing notes.  Pertinent labs & imaging results that were available during my care of the patient were reviewed by me and considered in my medical decision making (see chart for details).       Final Clinical Impressions(s) / UC Diagnoses   Final diagnoses:  Acute maxillary sinusitis, recurrence not specified  Cough    New Prescriptions New Prescriptions   AZITHROMYCIN (ZITHROMAX Z-PAK) 250 MG TABLET    2 tabs po once  day 1, then 1 tab po qd for next 4 days   CHLORPHENIRAMINE-HYDROCODONE (TUSSIONEX PENNKINETIC ER) 10-8 MG/5ML SUER    Take 5 mLs by mouth every 12 (twelve) hours as needed.   1. diagnosis reviewed with patient 2. rx as per orders above; reviewed possible side effects, interactions, risks and benefits  3. Recommend supportive treatment with rest, fluids 4. Follow-up prn if symptoms worsen or don't improve   Payton Mccallumrlando Romy Ipock, MD 07/03/16 1434

## 2016-07-03 NOTE — ED Triage Notes (Signed)
Patient c/o nasal congestion and sinus congestion and HAs that started yesterday.

## 2019-11-09 ENCOUNTER — Encounter: Payer: Self-pay | Admitting: Emergency Medicine

## 2019-11-09 ENCOUNTER — Other Ambulatory Visit: Payer: Self-pay

## 2019-11-09 ENCOUNTER — Ambulatory Visit
Admission: EM | Admit: 2019-11-09 | Discharge: 2019-11-09 | Disposition: A | Discharge status: Home / Self Care | Payer: BC Managed Care – PPO

## 2019-11-09 DIAGNOSIS — M26621 Arthralgia of right temporomandibular joint: Secondary | ICD-10-CM | POA: Diagnosis not present

## 2019-11-09 MED ORDER — MELOXICAM 15 MG PO TABS: 15.0000 mg | ORAL_TABLET | Freq: Every day | ORAL | 0 refills | Status: DC | PRN

## 2019-11-09 MED ORDER — CYCLOBENZAPRINE HCL 10 MG PO TABS: 10.0000 mg | ORAL_TABLET | Freq: Every day | ORAL | 0 refills | Status: DC

## 2019-11-09 NOTE — ED Provider Notes (Signed)
[facility] ____________________________________________  Time seen: Approximately 9:14 AM  I have reviewed the triage vital signs and the nursing notes.   HISTORY  Chief Complaint Jaw Pain   HPI Morgan Mcdowell is a 39 y.o. female presenting for evaluation of right ear right jaw pain present for the last few days.  Patient reports she has been having this is an intermittent issue for the last few months but it will go away and sometimes come back.  States tends to come back when she is under more stress.  Reports that her husband does state that she often grinds her teeth at night.  Has been following with a dentist and has not had any other dental issues.  Denies gumline tenderness or gum swelling.  Denies facial swelling, or rash, injury, hearing changes, cough, congestion, chest pain or shortness of breath or fevers.  Did take some leftover pain medicine of Tylenol with codeine last night without resolution.  Does feel some pain with opening closing her mouth.  Denies other aggravating alleviating factors.  Reports otherwise doing well.  Duanne Limerick, MD : PCP Patient's last menstrual period was 10/26/2019 (approximate). Denies pregnancy.   Past Medical History:  Diagnosis Date  . Ectopic pregnancy   . GERD (gastroesophageal reflux disease)    OCC-TUMS PRN    Patient Active Problem List   Diagnosis Date Noted  . Ectopic pregnancy, tubal 12/11/2015  . Encounter for sterilization 12/11/2015    Past Surgical History:  Procedure Laterality Date  . CHOLECYSTECTOMY    . DG GALL BLADDER    . DIAGNOSTIC LAPAROSCOPY WITH REMOVAL OF ECTOPIC PREGNANCY N/A 12/11/2015   Procedure: DIAGNOSTIC LAPAROSCOPY WITH REMOVAL OF ECTOPIC PREGNANCY;  Surgeon: Conard Novak, MD;  Location: [facility];  Service: Gynecology;  Laterality: N/A;  . DILATION AND CURETTAGE OF UTERUS    . LAPAROSCOPIC BILATERAL SALPINGECTOMY Bilateral 12/11/2015   Procedure: LAPAROSCOPIC BILATERAL  SALPINGECTOMY;  Surgeon: Conard Novak, MD;  Location: [facility];  Service: Gynecology;  Laterality: Bilateral;     No current facility-administered medications for this encounter.  Current Outpatient Medications:  .  TREMFYA 100 MG/ML SOPN, Inject into the skin., Disp: , Rfl:  .  cyclobenzaprine (FLEXERIL) 10 MG tablet, Take 1 tablet (10 mg total) by mouth at bedtime. Do not drive while taking as can cause drowsiness, Disp: 15 tablet, Rfl: 0 .  meloxicam (MOBIC) 15 MG tablet, Take 1 tablet (15 mg total) by mouth daily as needed., Disp: 10 tablet, Rfl: 0  Allergies Patient has no known allergies.  Family History  Problem Relation Age of Onset  . Healthy Mother   . Healthy Father     Social History Social History   Tobacco Use  . Smoking status: Current Every Day Smoker    Packs/day: 0.50    Years: 15.00    Pack years: 7.50    Types: Cigarettes  . Smokeless tobacco: Never Used  Substance Use Topics  . Alcohol use: No  . Drug use: No    Review of Systems Constitutional: No fever ENT: No sore throat. As above.  Cardiovascular: Denies chest pain. Respiratory: Denies shortness of breath. Musculoskeletal: Negative for back pain. Skin: Negative for rash.   ____________________________________________   PHYSICAL EXAM:  VITAL SIGNS: ED Triage Vitals  Enc Vitals Group     BP 11/09/19 0820 (!) 134/93     Pulse Rate 11/09/19 0820 80     Resp 11/09/19 0820 14     Temp  11/09/19 0820 98.4 F (36.9 C)     Temp Source 11/09/19 0820 Oral     SpO2 11/09/19 0820 99 %     Weight 11/09/19 0817 172 lb (78 kg)     Height 11/09/19 0817 5\' 5"  (1.651 m)     Head Circumference --      Peak Flow --      Pain Score 11/09/19 0817 7     Pain Loc --      Pain Edu? --      Excl. in GC? --     Constitutional: Alert and oriented. Well appearing and in no acute distress. Eyes: Conjunctivae are normal.  ENT      Head: Normocephalic and atraumatic.      Ears: Left: Nontender,  normal canal, no erythema, normal TM.  Right: Nontender, normal canal, no erythema, normal TM.  No mastoid tenderness bilaterally.  No left TMJ tenderness.  Positive right TMJ tenderness.  Able to fully open to close both.  No facial swelling or erythema noted.      Nose: No congestion/rhinnorhea.      Mouth/Throat: Mucous membranes are moist.Oropharynx non-erythematous.  No tonsillar swelling or exudate.  Patient does have some dental decay noted, however no gumline tenderness, erythema or edema noted. Neck: No stridor. Supple without meningismus.  Hematological/Lymphatic/Immunilogical: No cervical lymphadenopathy. Cardiovascular: Good peripheral circulation. Respiratory: Normal respiratory effort without tachypnea nor retractions.  Musculoskeletal:  Steady gait.  Neurologic:  Normal speech and language. Skin:  Skin is warm, dry and intact. No rash noted. Psychiatric: Mood and affect are normal. Speech and behavior are normal. Patient exhibits appropriate insight and judgment   ___________________________________________   LABS (all labs ordered are listed, but only abnormal results are displayed)  Labs Reviewed - No data to display ____________________________________________  PROCEDURES Procedures    INITIAL IMPRESSION / ASSESSMENT AND PLAN / ED COURSE  Pertinent labs & imaging results that were available during my care of the patient were reviewed by me and considered in my medical decision making (see chart for details).  Well-appearing patient.  No acute distress.  Suspect right TMJ.  No clear signs of infection at this time.,  Counseled patient to monitor.  Will treat with oral Mobic and as needed Flexeril at night.  Encourage patient to use mouthguard and follow-up with her dentist due to grinding teeth.  Supportive care.Discussed indication, risks and benefits of medications with patient.   Discussed follow up with Primary care physician this week. Discussed follow up and  return parameters including no resolution or any worsening concerns. Patient verbalized understanding and agreed to plan.   ____________________________________________   FINAL CLINICAL IMPRESSION(S) / ED DIAGNOSES  Final diagnoses:  Arthralgia of right temporomandibular joint     ED Discharge Orders         Ordered    meloxicam (MOBIC) 15 MG tablet  Daily PRN     Discontinue  Reprint     11/09/19 0840    cyclobenzaprine (FLEXERIL) 10 MG tablet  Daily at bedtime     Discontinue  Reprint     11/09/19 0840           Note: This dictation was prepared with Dragon dictation along with smaller phrase technology. Any transcriptional errors that result from this process are unintentional.         11/11/19, NP 11/09/19 [phone]

## 2019-11-09 NOTE — Discharge Instructions (Addendum)
Take medication as prescribed. Rest. Drink plenty of fluids. Mouth guard.   Follow up with your primary care physician this week as needed. Return to Urgent care for new or worsening concerns.

## 2019-11-09 NOTE — ED Triage Notes (Signed)
Patient c/o right sided jaw and dental pain that started 2 months ago. Patient states that her pain started back again couple of days ago.

## 2020-10-22 ENCOUNTER — Other Ambulatory Visit: Payer: Self-pay | Admitting: Dermatology

## 2020-10-22 ENCOUNTER — Ambulatory Visit: Admission: RE | Admit: 2020-10-22 | Payer: Self-pay | Source: Home / Self Care

## 2020-10-22 ENCOUNTER — Other Ambulatory Visit: Payer: Self-pay

## 2020-10-23 ENCOUNTER — Ambulatory Visit
Admission: RE | Admit: 2020-10-23 | Discharge: 2020-10-23 | Disposition: A | Discharge status: Home / Self Care | Payer: BC Managed Care – PPO | Source: Ambulatory Visit | Attending: Dermatology | Admitting: Dermatology

## 2020-10-23 ENCOUNTER — Ambulatory Visit
Admission: RE | Admit: 2020-10-23 | Discharge: 2020-10-23 | Disposition: A | Discharge status: Home / Self Care | Payer: BC Managed Care – PPO | Attending: Dermatology | Admitting: Dermatology

## 2020-10-23 ENCOUNTER — Other Ambulatory Visit: Payer: Self-pay | Admitting: Dermatology

## 2020-10-23 DIAGNOSIS — R7612 Nonspecific reaction to cell mediated immunity measurement of gamma interferon antigen response without active tuberculosis: Secondary | ICD-10-CM

## 2020-10-27 ENCOUNTER — Other Ambulatory Visit
Admission: RE | Admit: 2020-10-27 | Discharge: 2020-10-27 | Disposition: A | Discharge status: Home / Self Care | Payer: BC Managed Care – PPO | Source: Ambulatory Visit | Attending: Infectious Diseases | Admitting: Infectious Diseases

## 2020-10-27 ENCOUNTER — Other Ambulatory Visit: Payer: Self-pay

## 2020-10-27 ENCOUNTER — Ambulatory Visit: Payer: BC Managed Care – PPO | Attending: Infectious Diseases | Admitting: Infectious Diseases

## 2020-10-27 VITALS — BP 145/85 | HR 91 | Resp 16 | Ht 65.0 in | Wt 180.0 lb

## 2020-10-27 DIAGNOSIS — L409 Psoriasis, unspecified: Secondary | ICD-10-CM | POA: Insufficient documentation

## 2020-10-27 DIAGNOSIS — F17210 Nicotine dependence, cigarettes, uncomplicated: Secondary | ICD-10-CM | POA: Insufficient documentation

## 2020-10-27 DIAGNOSIS — R7612 Nonspecific reaction to cell mediated immunity measurement of gamma interferon antigen response without active tuberculosis: Secondary | ICD-10-CM | POA: Insufficient documentation

## 2020-10-27 LAB — CBC WITH DIFFERENTIAL/PLATELET
Abs Immature Granulocytes: 0.04 10*3/uL (ref 0.00–0.07)
Basophils Absolute: 0 10*3/uL (ref 0.0–0.1)
Basophils Relative: 0 %
Eosinophils Absolute: 0.1 10*3/uL (ref 0.0–0.5)
Eosinophils Relative: 1 %
HCT: 37.2 % (ref 36.0–46.0)
Hemoglobin: 12.5 g/dL (ref 12.0–15.0)
Immature Granulocytes: 0 %
Lymphocytes Relative: 34 %
Lymphs Abs: 4.4 10*3/uL — ABNORMAL HIGH (ref 0.7–4.0)
MCH: 31.4 pg (ref 26.0–34.0)
MCHC: 33.6 g/dL (ref 30.0–36.0)
MCV: 93.5 fL (ref 80.0–100.0)
Monocytes Absolute: 0.7 10*3/uL (ref 0.1–1.0)
Monocytes Relative: 5 %
Neutro Abs: 7.8 10*3/uL — ABNORMAL HIGH (ref 1.7–7.7)
Neutrophils Relative %: 60 %
Platelets: 346 10*3/uL (ref 150–400)
RBC: 3.98 MIL/uL (ref 3.87–5.11)
RDW: 13.6 % (ref 11.5–15.5)
WBC: 13.2 10*3/uL — ABNORMAL HIGH (ref 4.0–10.5)
nRBC: 0 % (ref 0.0–0.2)

## 2020-10-27 LAB — COMPREHENSIVE METABOLIC PANEL
ALT: 18 U/L (ref 0–44)
AST: 15 U/L (ref 15–41)
Albumin: 3.9 g/dL (ref 3.5–5.0)
Alkaline Phosphatase: 81 U/L (ref 38–126)
Anion gap: 6 (ref 5–15)
BUN: 9 mg/dL (ref 6–20)
CO2: 26 mmol/L (ref 22–32)
Calcium: 9.2 mg/dL (ref 8.9–10.3)
Chloride: 104 mmol/L (ref 98–111)
Creatinine, Ser: 0.52 mg/dL (ref 0.44–1.00)
GFR, Estimated: >60 mL/min (ref >60)
Glucose, Bld: 107 mg/dL — ABNORMAL HIGH (ref 70–99)
Potassium: 3.9 mmol/L (ref 3.5–5.1)
Sodium: 136 mmol/L (ref 135–145)
Total Bilirubin: 0.4 mg/dL (ref 0.3–1.2)
Total Protein: 7.8 g/dL (ref 6.5–8.1)

## 2020-10-27 NOTE — Patient Instructions (Signed)
You are referred to me for a positive quantiferon gold which was done as you are on Tremfya for psoriasis ( 1 year) We have checked PPD today and also repeated quant god- will give rifampin if either one is positive

## 2020-10-27 NOTE — Progress Notes (Signed)
NAME: Morgan Mcdowell  DOB: [birth year]  MRN: [id number]  Date/Time: 10/27/2020 12:15 PM   Subjective:   ? Morgan Mcdowell is a 40 y.o. female with a history of Psoriasis on Tremfya is referred to me for a positive quantiferon gold . When she started Tremfya ( an interleukin 23 inhibitor) a year ago she had negative quant gold. 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 No known contact with TB, no cough or SOB, or fever or chills or weight loss CXR done recently on 10/23/20 is normal  Past Medical History:  Diagnosis Date   Ectopic pregnancy    GERD (gastroesophageal reflux disease)    OCC-TUMS PRN    Past Surgical History:  Procedure Laterality Date   CHOLECYSTECTOMY     DG GALL BLADDER     DIAGNOSTIC LAPAROSCOPY WITH REMOVAL OF ECTOPIC PREGNANCY N/A 12/11/2015   Procedure: DIAGNOSTIC LAPAROSCOPY WITH REMOVAL OF ECTOPIC PREGNANCY;  Surgeon: Conard Novak, MD;  Location: [facility];  Service: Gynecology;  Laterality: N/A;   DILATION AND CURETTAGE OF UTERUS     LAPAROSCOPIC BILATERAL SALPINGECTOMY Bilateral 12/11/2015   Procedure: LAPAROSCOPIC BILATERAL SALPINGECTOMY;  Surgeon: Conard Novak, MD;  Location: [facility];  Service: Gynecology;  Laterality: Bilateral;    Social History   Socioeconomic History   Marital status: Married    Spouse name: Not on file   Number of children: Not on file   Years of education: Not on file   Highest education level: Not on file  Occupational History   Not on file  Tobacco Use   Smoking status: Every Day    Packs/day: 0.50    Years: 15.00    Pack years: 7.50    Types: Cigarettes   Smokeless tobacco: Never  Substance and Sexual Activity   Alcohol use: No   Drug use: No   Sexual activity: Yes  Other Topics Concern   Not on file  Social History Narrative   Not on file   Social Determinants of Health   Financial Resource Strain: Not on file  Food Insecurity: Not on file  Transportation Needs: Not on file  Physical Activity:  Not on file  Stress: Not on file  Social Connections: Not on file  Intimate Partner Violence: Not on file    Family History  Problem Relation Age of Onset   Healthy Mother    Healthy Father    No Known Allergies I? Current Outpatient Medications  Medication Sig Dispense Refill   cyclobenzaprine (FLEXERIL) 10 MG tablet Take 1 tablet (10 mg total) by mouth at bedtime. Do not drive while taking as can cause drowsiness 15 tablet 0   TREMFYA 100 MG/ML SOPN Inject into the skin.     No current facility-administered medications for this visit.     Abtx:  Anti-infectives (From admission, onward)    None       REVIEW OF SYSTEMS:  Const: negative fever, negative chills, negative weight loss Eyes: negative diplopia or visual changes, negative eye pain ENT: negative coryza, negative sore throat Resp: negative cough, hemoptysis, dyspnea Cards: negative for chest pain, palpitations, lower extremity edema GU: negative for frequency, dysuria and hematuria GI: Negative for abdominal pain, diarrhea, bleeding, constipation Skin: psoriasis- cleared up with tremfya q8 week injection Heme: negative for easy bruising and gum/nose bleeding MS: negative for myalgias, arthralgias, back pain and muscle weakness Neurolo:negative for headaches, dizziness, vertigo, memory problems  Psych: negative for feelings of anxiety, depression  Endocrine: negative for  thyroid, diabetes Allergy/Immunology- negative for any medication or food allergies ?  Objective:  VITALS:  BP (!) 145/85   Pulse 91   Resp 16   Ht 5\' 5"  (1.651 m)   Wt 180 lb (81.6 kg)   SpO2 95%   BMI 29.95 kg/m  PHYSICAL EXAM:  General: Alert, cooperative, no distress, appears stated age.  Head: Normocephalic, without obvious abnormality, atraumatic. Eyes: Conjunctivae clear, anicteric sclerae. Pupils are equal ENT Nares normal. No drainage or sinus tenderness. Lips, mucosa, and tongue normal. No Thrush Neck: Supple, symmetrical,  no adenopathy, thyroid: non tender no carotid bruit and no JVD. Back: No CVA tenderness. Lungs: Clear to auscultation bilaterally. No Wheezing or Rhonchi. No rales. Heart: Regular rate and rhythm, no murmur, rub or gallop. Abdomen: Soft, non-tender,not distended. Bowel sounds normal. No masses Extremities: atraumatic, no cyanosis. No edema. No clubbing Skin: No rashes or lesions. Or bruising Lymph: Cervical, supraclavicular normal. Neurologic: Grossly non-focal Pertinent Labs Lab Results CBC    Component Value Date/Time   WBC 11.6 (H) 12/11/2015 1107   RBC 4.09 12/11/2015 1107   HGB 12.6 12/11/2015 1107   HCT 36.7 12/11/2015 1107   PLT 344 12/11/2015 1107   MCV 89.7 12/11/2015 1107   MCH 30.9 12/11/2015 1107   MCHC 34.4 12/11/2015 1107   RDW 14.0 12/11/2015 1107    CMP Latest Ref Rng & Units 12/11/2015 11/23/2015  Glucose 65 - 99 mg/dL 95 11/25/2015)  BUN 6 - 20 mg/dL 8 6  Creatinine 811(X - 1.00 mg/dL 7.26 2.03  Sodium 5.59 - 145 mmol/L 138 135  Potassium 3.5 - 5.1 mmol/L 3.8 3.3(L)  Chloride 101 - 111 mmol/L 109 102  CO2 22 - 32 mmol/L 24 21(L)  Calcium 8.9 - 10.3 mg/dL 9.0 9.0  Total Protein 6.5 - 8.1 g/dL 7.7 7.9  Total Bilirubin 0.3 - 1.2 mg/dL 0.4 0.4  Alkaline Phos 38 - 126 U/L 75 79  AST 15 - 41 U/L 17 23  ALT 14 - 54 U/L 19 27     IMAGING RESULTS:  CXR -no infiltrate I have personally reviewed the films ? Impression/Recommendation ? Positive quantiferon gold - no exposure to TB- high risk for progression because of being on immune modulator . Will do PPD today and repeat quant Gold If either one positive will start Rifampin 600mg  once a day for 4 months  Psoriasis -on Interleukin 23 inhibitor- is on hold because of Positive quantiferon gold- If she is started on Rifampin she can restart the injection of Tremfya in 4 weeks   ? ? ___________________________________________________ Discussed with patient in detail Note:  This document was prepared using  Dragon voice recognition software and may include unintentional dictation errors.

## 2020-10-28 NOTE — Addendum Note (Signed)
Addended by: Clayborne Artist A on: 10/28/2020 10:31 AM   Modules accepted: Orders

## 2020-10-29 ENCOUNTER — Ambulatory Visit: Payer: BC Managed Care – PPO | Attending: Infectious Diseases

## 2020-10-29 ENCOUNTER — Other Ambulatory Visit: Payer: Self-pay

## 2020-10-29 LAB — QUANTIFERON-TB GOLD PLUS (RQFGPL)
QuantiFERON Mitogen Value: >10 IU/mL
QuantiFERON Nil Value: 0 IU/mL
QuantiFERON TB1 Ag Value: 0 IU/mL
QuantiFERON TB2 Ag Value: 2.86 IU/mL

## 2020-10-29 LAB — TB SKIN TEST
Induration: 0 mm
TB Skin Test: NEGATIVE

## 2020-10-29 LAB — QUANTIFERON-TB GOLD PLUS: QuantiFERON-TB Gold Plus: POSITIVE — AB

## 2020-11-03 ENCOUNTER — Telehealth: Payer: Self-pay

## 2020-11-03 ENCOUNTER — Other Ambulatory Visit: Payer: Self-pay | Admitting: Infectious Diseases

## 2020-11-03 DIAGNOSIS — Z227 Latent tuberculosis: Secondary | ICD-10-CM

## 2020-11-03 MED ORDER — RIFAMPIN 300 MG PO CAPS: 600.0000 mg | ORAL_CAPSULE | Freq: Every day | ORAL | 3 refills | Status: DC

## 2020-11-03 NOTE — Telephone Encounter (Signed)
Need to advise Quant. Gold TB lab was positive and Rifampin 300 mg has been sent in to take 2 (600 mg) daily and recheck labs in one month(Already ordered). She will come to [facility] and go to [facility] for Lab Only visit in one month. We will call her with those results after she gets labs done.

## 2020-11-03 NOTE — Telephone Encounter (Signed)
Left message to call back  

## 2020-11-13 ENCOUNTER — Telehealth: Payer: Self-pay

## 2020-11-13 DIAGNOSIS — R7612 Nonspecific reaction to cell mediated immunity measurement of gamma interferon antigen response without active tuberculosis: Secondary | ICD-10-CM

## 2020-11-13 NOTE — Telephone Encounter (Signed)
Tc with patient. 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  States she has been negative in the past. Answered questions and instructed to call ID Dr or [facility] if future TB concerns. Richmond Campbell, RN

## 2020-11-13 NOTE — Telephone Encounter (Signed)
Attempted TC to patient re: +QFT. 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

## 2021-02-23 ENCOUNTER — Telehealth: Payer: Self-pay

## 2021-02-23 NOTE — Telephone Encounter (Signed)
Patient calling to see if she had to do 3 months of treatment or 4 months for Rifampin. Advised her that she was supposed to do 4 months and she has one left. Patient understood and will pick up last refill.

## 2021-03-06 ENCOUNTER — Emergency Department: Payer: BC Managed Care – PPO

## 2021-03-06 ENCOUNTER — Other Ambulatory Visit: Payer: Self-pay

## 2021-03-06 ENCOUNTER — Encounter
Admission: EM | Disposition: A | Discharge status: Home / Self Care | Payer: Self-pay | Source: Home / Self Care | Attending: Cardiovascular Disease

## 2021-03-06 ENCOUNTER — Ambulatory Visit
Admission: EM | Admit: 2021-03-06 | Discharge: 2021-03-06 | Disposition: A | Discharge status: Other Acute Inpatient Hospital | Payer: BC Managed Care – PPO | Attending: Physician Assistant | Admitting: Physician Assistant

## 2021-03-06 ENCOUNTER — Encounter: Payer: Self-pay | Admitting: Emergency Medicine

## 2021-03-06 ENCOUNTER — Inpatient Hospital Stay
Admission: EM | Admit: 2021-03-06 | Discharge: 2021-03-08 | DRG: 247 | Disposition: A | Discharge status: Home / Self Care | Payer: BC Managed Care – PPO | Attending: Cardiovascular Disease | Admitting: Cardiovascular Disease

## 2021-03-06 DIAGNOSIS — I252 Old myocardial infarction: Secondary | ICD-10-CM | POA: Diagnosis not present

## 2021-03-06 DIAGNOSIS — Z79899 Other long term (current) drug therapy: Secondary | ICD-10-CM | POA: Diagnosis not present

## 2021-03-06 DIAGNOSIS — E7800 Pure hypercholesterolemia, unspecified: Secondary | ICD-10-CM

## 2021-03-06 DIAGNOSIS — Z8615 Personal history of latent tuberculosis infection: Secondary | ICD-10-CM

## 2021-03-06 DIAGNOSIS — Z720 Tobacco use: Secondary | ICD-10-CM

## 2021-03-06 DIAGNOSIS — E785 Hyperlipidemia, unspecified: Secondary | ICD-10-CM | POA: Diagnosis not present

## 2021-03-06 DIAGNOSIS — I472 Ventricular tachycardia, unspecified: Secondary | ICD-10-CM | POA: Diagnosis present

## 2021-03-06 DIAGNOSIS — I2119 ST elevation (STEMI) myocardial infarction involving other coronary artery of inferior wall: Secondary | ICD-10-CM | POA: Diagnosis present

## 2021-03-06 DIAGNOSIS — Z9049 Acquired absence of other specified parts of digestive tract: Secondary | ICD-10-CM

## 2021-03-06 DIAGNOSIS — F17210 Nicotine dependence, cigarettes, uncomplicated: Secondary | ICD-10-CM | POA: Diagnosis present

## 2021-03-06 DIAGNOSIS — I213 ST elevation (STEMI) myocardial infarction of unspecified site: Secondary | ICD-10-CM

## 2021-03-06 DIAGNOSIS — R079 Chest pain, unspecified: Secondary | ICD-10-CM

## 2021-03-06 DIAGNOSIS — I2510 Atherosclerotic heart disease of native coronary artery without angina pectoris: Secondary | ICD-10-CM | POA: Diagnosis present

## 2021-03-06 DIAGNOSIS — I2583 Coronary atherosclerosis due to lipid rich plaque: Secondary | ICD-10-CM | POA: Diagnosis not present

## 2021-03-06 DIAGNOSIS — Z20822 Contact with and (suspected) exposure to covid-19: Secondary | ICD-10-CM | POA: Diagnosis present

## 2021-03-06 DIAGNOSIS — K219 Gastro-esophageal reflux disease without esophagitis: Secondary | ICD-10-CM | POA: Diagnosis present

## 2021-03-06 DIAGNOSIS — R7612 Nonspecific reaction to cell mediated immunity measurement of gamma interferon antigen response without active tuberculosis: Secondary | ICD-10-CM | POA: Diagnosis present

## 2021-03-06 HISTORY — PX: LEFT HEART CATH AND CORONARY ANGIOGRAPHY: CATH118249

## 2021-03-06 HISTORY — PX: CORONARY/GRAFT ACUTE MI REVASCULARIZATION: CATH118305

## 2021-03-06 LAB — COMPREHENSIVE METABOLIC PANEL
ALT: 19 U/L (ref 0–44)
AST: 16 U/L (ref 15–41)
Albumin: 2.8 g/dL — ABNORMAL LOW (ref 3.5–5.0)
Alkaline Phosphatase: 71 U/L (ref 38–126)
Anion gap: 7 (ref 5–15)
BUN: 11 mg/dL (ref 6–20)
CO2: 19 mmol/L — ABNORMAL LOW (ref 22–32)
Calcium: 7.7 mg/dL — ABNORMAL LOW (ref 8.9–10.3)
Chloride: 100 mmol/L (ref 98–111)
Creatinine, Ser: 0.36 mg/dL — ABNORMAL LOW (ref 0.44–1.00)
GFR, Estimated: >60 mL/min (ref >60)
Glucose, Bld: 133 mg/dL — ABNORMAL HIGH (ref 70–99)
Potassium: 3.5 mmol/L (ref 3.5–5.1)
Sodium: 126 mmol/L — ABNORMAL LOW (ref 135–145)
Total Bilirubin: 0.3 mg/dL (ref 0.3–1.2)
Total Protein: 6.2 g/dL — ABNORMAL LOW (ref 6.5–8.1)

## 2021-03-06 LAB — CBC WITH DIFFERENTIAL/PLATELET
Abs Immature Granulocytes: 0.08 10*3/uL — ABNORMAL HIGH (ref 0.00–0.07)
Basophils Absolute: 0 10*3/uL (ref 0.0–0.1)
Basophils Relative: 0 %
Eosinophils Absolute: 0.1 10*3/uL (ref 0.0–0.5)
Eosinophils Relative: 1 %
HCT: 41 % (ref 36.0–46.0)
Hemoglobin: 13.6 g/dL (ref 12.0–15.0)
Immature Granulocytes: 0 %
Lymphocytes Relative: 15 %
Lymphs Abs: 2.8 10*3/uL (ref 0.7–4.0)
MCH: 31.3 pg (ref 26.0–34.0)
MCHC: 33.2 g/dL (ref 30.0–36.0)
MCV: 94.3 fL (ref 80.0–100.0)
Monocytes Absolute: 0.6 10*3/uL (ref 0.1–1.0)
Monocytes Relative: 4 %
Neutro Abs: 14.6 10*3/uL — ABNORMAL HIGH (ref 1.7–7.7)
Neutrophils Relative %: 80 %
Platelets: 310 10*3/uL (ref 150–400)
RBC: 4.35 MIL/uL (ref 3.87–5.11)
RDW: 13.3 % (ref 11.5–15.5)
WBC: 18.3 10*3/uL — ABNORMAL HIGH (ref 4.0–10.5)
nRBC: 0 % (ref 0.0–0.2)

## 2021-03-06 LAB — APTT: aPTT: >200 seconds (ref 24–36)

## 2021-03-06 LAB — HCG, QUANTITATIVE, PREGNANCY: hCG, Beta Chain, Quant, S: <1 m[IU]/mL (ref <5)

## 2021-03-06 LAB — TROPONIN I (HIGH SENSITIVITY)
Troponin I (High Sensitivity): 47 ng/L — ABNORMAL HIGH (ref <18)
Troponin I (High Sensitivity): >24000 ng/L (ref <18)

## 2021-03-06 LAB — LIPID PANEL
Cholesterol: 211 mg/dL — ABNORMAL HIGH (ref 0–200)
HDL: 22 mg/dL — ABNORMAL LOW (ref >40)
LDL Cholesterol: 172 mg/dL — ABNORMAL HIGH (ref 0–99)
Total CHOL/HDL Ratio: 9.6 RATIO
Triglycerides: 84 mg/dL (ref <150)
VLDL: 17 mg/dL (ref 0–40)

## 2021-03-06 LAB — RESP PANEL BY RT-PCR (FLU A&B, COVID) ARPGX2
Influenza A by PCR: NEGATIVE
Influenza B by PCR: NEGATIVE
SARS Coronavirus 2 by RT PCR: NEGATIVE

## 2021-03-06 LAB — HEMOGLOBIN A1C
Hgb A1c MFr Bld: 6.2 % — ABNORMAL HIGH (ref 4.8–5.6)
Mean Plasma Glucose: 131.24 mg/dL

## 2021-03-06 LAB — PROTIME-INR
INR: 1.5 — ABNORMAL HIGH (ref 0.8–1.2)
Prothrombin Time: 18.4 seconds — ABNORMAL HIGH (ref 11.4–15.2)

## 2021-03-06 LAB — MAGNESIUM: Magnesium: 2.1 mg/dL (ref 1.7–2.4)

## 2021-03-06 LAB — GLUCOSE, CAPILLARY: Glucose-Capillary: 125 mg/dL — ABNORMAL HIGH (ref 70–99)

## 2021-03-06 LAB — MRSA NEXT GEN BY PCR, NASAL: MRSA by PCR Next Gen: NOT DETECTED

## 2021-03-06 SURGERY — CORONARY/GRAFT ACUTE MI REVASCULARIZATION
Anesthesia: Moderate Sedation

## 2021-03-06 MED ORDER — ATROPINE SULFATE 1 MG/10ML IJ SOSY
PREFILLED_SYRINGE | INTRAMUSCULAR | Status: AC
  Filled 2021-03-06: qty 10

## 2021-03-06 MED ORDER — MIDAZOLAM HCL 2 MG/2ML IJ SOLN
INTRAMUSCULAR | Status: AC
  Filled 2021-03-06: qty 2

## 2021-03-06 MED ORDER — ATORVASTATIN CALCIUM 20 MG PO TABS
80.0000 mg | ORAL_TABLET | Freq: Every day | ORAL | Status: DC
  Administered 2021-03-06 – 2021-03-08 (×3): 80 mg via ORAL
  Filled 2021-03-06 (×3): qty 4

## 2021-03-06 MED ORDER — FENTANYL CITRATE (PF) 100 MCG/2ML IJ SOLN
INTRAMUSCULAR | Status: DC | PRN
  Administered 2021-03-06: 25 ug via INTRAVENOUS

## 2021-03-06 MED ORDER — RIFAMPIN 300 MG PO CAPS
600.0000 mg | ORAL_CAPSULE | Freq: Every day | ORAL | Status: DC
  Administered 2021-03-06 – 2021-03-08 (×3): 600 mg via ORAL
  Filled 2021-03-06 (×3): qty 2

## 2021-03-06 MED ORDER — MORPHINE SULFATE (PF) 2 MG/ML IV SOLN
2.0000 mg | INTRAVENOUS | Status: DC | PRN
  Administered 2021-03-06 – 2021-03-07 (×2): 2 mg via INTRAVENOUS
  Filled 2021-03-06 (×2): qty 1

## 2021-03-06 MED ORDER — ONDANSETRON HCL 4 MG/2ML IJ SOLN
4.0000 mg | Freq: Four times a day (QID) | INTRAMUSCULAR | Status: DC | PRN
  Administered 2021-03-06 – 2021-03-07 (×3): 4 mg via INTRAVENOUS
  Filled 2021-03-06 (×3): qty 2

## 2021-03-06 MED ORDER — LIDOCAINE HCL (PF) 1 % IJ SOLN
INTRAMUSCULAR | Status: DC | PRN
  Administered 2021-03-06: 2 mL

## 2021-03-06 MED ORDER — METOPROLOL TARTRATE 25 MG PO TABS
12.5000 mg | ORAL_TABLET | ORAL | Status: AC
  Administered 2021-03-06: 12.5 mg via ORAL

## 2021-03-06 MED ORDER — FENTANYL CITRATE (PF) 100 MCG/2ML IJ SOLN
INTRAMUSCULAR | Status: AC
  Filled 2021-03-06: qty 2

## 2021-03-06 MED ORDER — SODIUM CHLORIDE 0.9 % IV SOLN: INTRAVENOUS | Status: AC

## 2021-03-06 MED ORDER — NITROGLYCERIN 1 MG/10 ML FOR IR/CATH LAB
INTRA_ARTERIAL | Status: DC | PRN
  Administered 2021-03-06: 200 ug via INTRACORONARY

## 2021-03-06 MED ORDER — METOPROLOL TARTRATE 25 MG PO TABS
12.5000 mg | ORAL_TABLET | Freq: Two times a day (BID) | ORAL | Status: DC
  Administered 2021-03-06 – 2021-03-07 (×3): 12.5 mg via ORAL
  Filled 2021-03-06 (×4): qty 1

## 2021-03-06 MED ORDER — HEPARIN (PORCINE) 25000 UT/250ML-% IV SOLN: 1000.0000 [IU]/h | INTRAVENOUS | Status: DC

## 2021-03-06 MED ORDER — ACETAMINOPHEN 325 MG PO TABS
650.0000 mg | ORAL_TABLET | ORAL | Status: DC | PRN
  Administered 2021-03-06 (×2): 650 mg via ORAL
  Filled 2021-03-06 (×2): qty 2

## 2021-03-06 MED ORDER — ONDANSETRON HCL 4 MG/2ML IJ SOLN
4.0000 mg | Freq: Once | INTRAMUSCULAR | Status: AC
  Administered 2021-03-06: 4 mg via INTRAVENOUS
  Filled 2021-03-06: qty 2

## 2021-03-06 MED ORDER — NITROGLYCERIN 1 MG/10 ML FOR IR/CATH LAB
INTRA_ARTERIAL | Status: AC
  Filled 2021-03-06: qty 10

## 2021-03-06 MED ORDER — NICOTINE 21 MG/24HR TD PT24
21.0000 mg | MEDICATED_PATCH | Freq: Every day | TRANSDERMAL | Status: DC
  Filled 2021-03-06: qty 1

## 2021-03-06 MED ORDER — HEPARIN (PORCINE) IN NACL 1000-0.9 UT/500ML-% IV SOLN
INTRAVENOUS | Status: AC
  Filled 2021-03-06: qty 1000

## 2021-03-06 MED ORDER — HEPARIN SODIUM (PORCINE) 1000 UNIT/ML IJ SOLN
INTRAMUSCULAR | Status: AC
  Filled 2021-03-06: qty 1

## 2021-03-06 MED ORDER — SODIUM CHLORIDE 0.9% FLUSH: 3.0000 mL | INTRAVENOUS | Status: DC | PRN

## 2021-03-06 MED ORDER — PRASUGREL HCL 10 MG PO TABS
10.0000 mg | ORAL_TABLET | Freq: Every day | ORAL | Status: DC
  Administered 2021-03-07 – 2021-03-08 (×2): 10 mg via ORAL
  Filled 2021-03-06 (×2): qty 1

## 2021-03-06 MED ORDER — ASPIRIN 81 MG PO CHEW: 324.0000 mg | CHEWABLE_TABLET | Freq: Once | ORAL | Status: DC

## 2021-03-06 MED ORDER — ASPIRIN 81 MG PO CHEW
81.0000 mg | CHEWABLE_TABLET | Freq: Every day | ORAL | Status: DC
  Administered 2021-03-06 – 2021-03-08 (×3): 81 mg via ORAL
  Filled 2021-03-06 (×3): qty 1

## 2021-03-06 MED ORDER — MIDAZOLAM HCL 2 MG/2ML IJ SOLN
INTRAMUSCULAR | Status: DC | PRN
  Administered 2021-03-06: 1 mg via INTRAVENOUS

## 2021-03-06 MED ORDER — SODIUM CHLORIDE 0.9% FLUSH
3.0000 mL | Freq: Two times a day (BID) | INTRAVENOUS | Status: DC
  Administered 2021-03-06 – 2021-03-08 (×5): 3 mL via INTRAVENOUS

## 2021-03-06 MED ORDER — PRASUGREL HCL 10 MG PO TABS
ORAL_TABLET | ORAL | Status: DC | PRN
  Administered 2021-03-06: 60 mg via ORAL

## 2021-03-06 MED ORDER — SODIUM CHLORIDE 0.9 % IV SOLN: INTRAVENOUS | Status: DC

## 2021-03-06 MED ORDER — VERAPAMIL HCL 2.5 MG/ML IV SOLN
INTRAVENOUS | Status: AC
  Filled 2021-03-06: qty 2

## 2021-03-06 MED ORDER — SODIUM CHLORIDE 0.9 % IV SOLN: 250.0000 mL | INTRAVENOUS | Status: DC | PRN

## 2021-03-06 MED ORDER — HEPARIN SODIUM (PORCINE) 1000 UNIT/ML IJ SOLN
INTRAMUSCULAR | Status: DC | PRN
  Administered 2021-03-06: 2000 [IU] via INTRAVENOUS
  Administered 2021-03-06: 5000 [IU] via INTRAVENOUS

## 2021-03-06 MED ORDER — LIDOCAINE HCL 1 % IJ SOLN
INTRAMUSCULAR | Status: AC
  Filled 2021-03-06: qty 20

## 2021-03-06 MED ORDER — ASPIRIN 81 MG PO CHEW
324.0000 mg | CHEWABLE_TABLET | Freq: Once | ORAL | Status: AC
  Administered 2021-03-06: 324 mg via ORAL

## 2021-03-06 MED ORDER — CHLORHEXIDINE GLUCONATE CLOTH 2 % EX PADS
6.0000 | MEDICATED_PAD | Freq: Every day | CUTANEOUS | Status: DC
  Administered 2021-03-06: 6 via TOPICAL

## 2021-03-06 MED ORDER — VERAPAMIL HCL 2.5 MG/ML IV SOLN
INTRAVENOUS | Status: DC | PRN
  Administered 2021-03-06: 2.5 mg via INTRAVENOUS

## 2021-03-06 MED ORDER — HEPARIN (PORCINE) IN NACL 1000-0.9 UT/500ML-% IV SOLN
INTRAVENOUS | Status: DC | PRN
  Administered 2021-03-06 (×2): 500 mL

## 2021-03-06 MED ORDER — ALPRAZOLAM 0.25 MG PO TABS
0.2500 mg | ORAL_TABLET | Freq: Three times a day (TID) | ORAL | Status: DC | PRN
  Administered 2021-03-06 – 2021-03-08 (×3): 0.25 mg via ORAL
  Filled 2021-03-06 (×3): qty 1

## 2021-03-06 MED ORDER — PRASUGREL HCL 10 MG PO TABS
ORAL_TABLET | ORAL | Status: AC
  Filled 2021-03-06: qty 6

## 2021-03-06 MED ORDER — HEPARIN SODIUM (PORCINE) 5000 UNIT/ML IJ SOLN
4000.0000 [IU] | Freq: Once | INTRAMUSCULAR | Status: AC
  Administered 2021-03-06: 4000 [IU] via INTRAVENOUS
  Filled 2021-03-06: qty 1

## 2021-03-06 MED ORDER — POTASSIUM CHLORIDE CRYS ER 20 MEQ PO TBCR
20.0000 meq | EXTENDED_RELEASE_TABLET | Freq: Once | ORAL | Status: AC
Start: 2021-03-06 — End: 2021-03-06
  Administered 2021-03-06: 20 meq via ORAL
  Filled 2021-03-06: qty 1

## 2021-03-06 SURGICAL SUPPLY — 18 items
BALLN TREK RX 2.5X12 (BALLOONS) ×2
BALLOON TREK RX 2.5X12 (BALLOONS) ×1 IMPLANT
CATH INFINITI 5 FR JL3.5 (CATHETERS) ×2 IMPLANT
CATH VISTA GUIDE 6FR JR4 (CATHETERS) ×2 IMPLANT
DEVICE RAD TR BAND REGULAR (VASCULAR PRODUCTS) ×2 IMPLANT
DRAPE BRACHIAL (DRAPES) ×2 IMPLANT
GLIDESHEATH SLEND SS 6F .021 (SHEATH) ×2 IMPLANT
KIT ENCORE 26 ADVANTAGE (KITS) ×2 IMPLANT
KIT SYRINGE INJ CVI SPIKEX1 (MISCELLANEOUS) ×2 IMPLANT
PACK CARDIAC CATH (CUSTOM PROCEDURE TRAY) ×2 IMPLANT
PROTECTION STATION PRESSURIZED (MISCELLANEOUS) ×2
SET ATX SIMPLICITY (MISCELLANEOUS) ×2 IMPLANT
STATION PROTECTION PRESSURIZED (MISCELLANEOUS) ×1 IMPLANT
STENT ONYX FRONTIER 2.5X12 (Permanent Stent) ×2 IMPLANT
TUBING CIL FLEX 10 FLL-RA (TUBING) ×2 IMPLANT
WIRE HITORQ VERSACORE ST 145CM (WIRE) ×2 IMPLANT
WIRE ROSEN-J .035X260CM (WIRE) ×2 IMPLANT
WIRE RUNTHROUGH .014X180CM (WIRE) ×2 IMPLANT

## 2021-03-06 NOTE — H&P (Signed)
Cardiology Admission History and Physical:   Patient ID: Morgan Mcdowell MRN: [id number]; DOB: 09-Feb-1981   Admission date: 03/06/2021  PCP:  Dortha Kern, MD   [facility] Providers Cardiologist:  None   new Kirke Corin)   Chief Complaint: Chest pain  Patient Profile:   Morgan Mcdowell is a 40 y.o. 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  History of Present Illness:   Morgan Mcdowell is a 40 year old female with no prior cardiac history.  She has known history of tobacco use and she smokes 1 pack/day.  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  She presented with acute onset of substernal chest pain and tightness that started around 6:30 in the morning and woke her up from sleep.  She vomited few times.  The pain was substernal described as tightness and heavy feeling radiating to her back.  She initially went to urgent care where an EKG was done and showed evidence of inferior ST elevation and thus EMS were called and a code STEMI was activated.  The patient was transferred to the ED.  In the ED, she was still having 4 out of 10 chest pain and her EKG showed persistent ST elevation.  She was given aspirin and 4000 units of unfractionated heparin.  I recommended proceeding with emergent cardiac catheterization and possible PCI after I explained the procedure.   Past Medical History:  Diagnosis Date   Ectopic pregnancy    GERD (gastroesophageal reflux disease)    OCC-TUMS PRN    Past Surgical History:  Procedure Laterality Date   CHOLECYSTECTOMY     DG GALL BLADDER     DIAGNOSTIC LAPAROSCOPY WITH REMOVAL OF ECTOPIC PREGNANCY N/A 12/11/2015   Procedure: DIAGNOSTIC LAPAROSCOPY WITH REMOVAL OF ECTOPIC PREGNANCY;  Surgeon: Conard Novak, MD;  Location: [facility] OR[facility];  Service: Gynecology;  Laterality: N/A;    DILATION AND CURETTAGE OF UTERUS     LAPAROSCOPIC BILATERAL SALPINGECTOMY Bilateral 12/11/2015   Procedure: LAPAROSCOPIC BILATERAL SALPINGECTOMY;  Surgeon: Conard Novak, MD;  Location: [facility];  Service: Gynecology;  Laterality: Bilateral;     Medications Prior to Admission: Prior to Admission medications   Medication Sig Start Date End Date Taking? Authorizing Provider  cyclobenzaprine (FLEXERIL) 10 MG tablet Take 1 tablet (10 mg total) by mouth at bedtime. Do not drive while taking as can cause drowsiness 11/09/19   Renford Dills, NP  rifampin (RIFADIN) 300 MG capsule Take 2 capsules (600 mg total) by mouth daily. 11/03/20   Lynn Ito, MD  TREMFYA 100 MG/ML SOPN Inject into the skin. 10/07/19   [provider]     Allergies:   No Known Allergies  Social History:   Social History   Socioeconomic History   Marital status: Married    Spouse name: Not on file   Number of children: Not on file   Years of education: Not on file   Highest education level: Not on file  Occupational History   Not on file  Tobacco Use   Smoking status: Every Day    Packs/day: 1.00    Years: 15.00    Pack years: 15.00    Types: Cigarettes   Smokeless tobacco: Never  Substance and Sexual Activity   Alcohol use: No   Drug use: No   Sexual activity: Yes  Other Topics Concern  Not on file  Social History Narrative   Not on file   Social Determinants of Health   Financial Resource Strain: Not on file  Food Insecurity: Not on file  Transportation Needs: Not on file  Physical Activity: Not on file  Stress: Not on file  Social Connections: Not on file  Intimate Partner Violence: Not on file    Family History:   The patient's family history includes Healthy in her father and mother.    ROS:  Please see the history of present illness.  All other ROS reviewed and negative.     Physical Exam/Data:   Vitals:   03/06/21 0855 03/06/21 0857 03/06/21 0857 03/06/21 1024   BP:   111/82 (!) 128/102  Pulse: 62   70  Resp: 14   (!) 21  Temp:    98.2 F (36.8 C)  TempSrc:   Oral Oral  SpO2: 99%   98%  Weight:  80.7 kg  82.6 kg  Height:  5\' 4"  (1.626 m)     No intake or output data in the 24 hours ending 03/06/21 1030 Last 3 Weights 03/06/2021 03/06/2021 03/06/2021  Weight (lbs) 182 lb 1.6 oz 178 lb 178 lb  Weight (kg) 82.6 kg 80.74 kg 80.74 kg     Body mass index is 31.26 kg/m.  General:  Well nourished, well developed, in no acute distress HEENT: normal Neck: no JVD Vascular: No carotid bruits; Distal pulses 2+ bilaterally   Cardiac:  normal S1, S2; RRR; no murmur  Lungs:  clear to auscultation bilaterally, no wheezing, rhonchi or rales  Abd: soft, nontender, no hepatomegaly  Ext: no edema Musculoskeletal:  No deformities, BUE and BLE strength normal and equal Skin: warm and dry  Neuro:  CNs 2-12 intact, no focal abnormalities noted Psych:  Normal affect    EKG:  The ECG that was done and was personally reviewed and demonstrates normal sinus rhythm with 2 to 3 mm of anterior ST elevation with reciprocal changes in the anterior leads.  Relevant CV Studies:   Laboratory Data:  High Sensitivity Troponin:   Recent Labs  Lab 03/06/21 0900  TROPONINIHS 47*      ChemistryNo results for input(s): NA, K, CL, CO2, GLUCOSE, BUN, CREATININE, CALCIUM, MG, GFRNONAA, GFRAA, ANIONGAP in the last 168 hours.  No results for input(s): PROT, ALBUMIN, AST, ALT, ALKPHOS, BILITOT in the last 168 hours. Lipids No results for input(s): CHOL, TRIG, HDL, LABVLDL, LDLCALC, CHOLHDL in the last 168 hours. Hematology Recent Labs  Lab 03/06/21 0900  WBC 18.3*  RBC 4.35  HGB 13.6  HCT 41.0  MCV 94.3  MCH 31.3  MCHC 33.2  RDW 13.3  PLT 310   Thyroid No results for input(s): TSH, FREET4 in the last 168 hours. BNPNo results for input(s): BNP, PROBNP in the last 168 hours.  DDimer No results for input(s): DDIMER in the last 168 hours.   Radiology/Studies:   CARDIAC CATHETERIZATION  Result Date: 03/06/2021   Prox RCA lesion is 30% stenosed.   RPAV lesion is 100% stenosed.   2nd RPL lesion is 100% stenosed.   A drug-eluting stent was successfully placed using a STENT ONYX FRONTIER 2.5X12.   Post intervention, there is a 0% residual stenosis.   The left ventricular systolic function is normal.   LV end diastolic pressure is moderately elevated.   The left ventricular ejection fraction is 50-55% by visual estimate. 1.  Severe one-vessel coronary artery disease with thrombotic occlusion of the proximal  right posterior AV groove branch of the right coronary artery.  In addition, the second posterolateral branch is occluded with evidence of thrombus in the terminal branch.  No other obstructive disease. 2.  Low normal LV systolic function with an EF of 50 to 55% with basal inferior wall hypokinesis.  Moderately elevated left ventricular end-diastolic pressure at 25 mmHg. 3.  Successful angioplasty and drug-eluting stent placement to the right posterior AV groove artery.  PL 2 was not treated given terminal occlusion and small vessel size. Recommendations: Dual antiplatelet therapy for at least 1 year. Aggressive treatment of risk factors. Smoking cessation is strongly advised and the patient was referred to cardiac rehab.   DG Chest Portable 1 View  Result Date: 03/06/2021 CLINICAL DATA:  Chest pain EXAM: PORTABLE CHEST 1 VIEW COMPARISON:  Chest radiograph dated October 23, 2020 FINDINGS: The heart size and mediastinal contours are within normal limits. Both lungs are clear. The visualized skeletal structures are unremarkable. IMPRESSION: No active disease. Electronically Signed   By: Larose Hires D.O.   On: 03/06/2021 09:20     Assessment and Plan:   Acute inferior ST elevation myocardial infarction: Emergent cardiac catheterization was done via the right radial artery which showed occluded posterior AV groove with evidence of plaque rupture and large thrombus.   This was treated successfully with angioplasty and drug-eluting stent placement.  The second posterolateral branch was also occluded distally due to embolization and this was too small to rescue.  Recommend dual antiplatelet therapy for at least 12 months.  Recommend aggressive treatment of risk factors and cardiac rehab. Tobacco use: I discussed with her the importance of smoking cessation and she is determined to quit. Hyperlipidemia: I started high-dose atorvastatin. History of latent TB: She is on long-term treatment with rifampin.  For that reason, I elected not to use ticagrelor given the interaction and instead used prasugrel.   Risk Assessment/Risk Scores:    TIMI Risk Score for ST  Elevation MI:   The patient's TIMI risk score is 0, which indicates a 0.8% risk of all cause mortality at 30 days.{    Severity of Illness: The appropriate patient status for this patient is INPATIENT. Inpatient status is judged to be reasonable and necessary in order to provide the required intensity of service to ensure the patient's safety. The patient's presenting symptoms, physical exam findings, and initial radiographic and laboratory data in the context of their chronic comorbidities is felt to place them at high risk for further clinical deterioration. Furthermore, it is not anticipated that the patient will be medically stable for discharge from the hospital within 2 midnights of admission.   * I certify that at the point of admission it is my clinical judgment that the patient will require inpatient hospital care spanning beyond 2 midnights from the point of admission due to high intensity of service, high risk for further deterioration and high frequency of surveillance required.*   For questions or updates, please contact [facility] Please consult [email or web address] for contact info under     Signed, Lorine Bears, MD  03/06/2021 10:30 AM

## 2021-03-06 NOTE — ED Provider Notes (Signed)
[facility] Emergency Department Provider Note   ____________________________________________   Event Date/Time   First MD Initiated Contact with Patient 03/06/21 [phone]     (approximate)  I have reviewed the triage vital signs and the nursing notes.   HISTORY  Chief Complaint Code STEMI    HPI Morgan Mcdowell is a 40 y.o. female with no significant past medical history who presents to the ED complaining of chest pain.  Patient reports that she was woken from sleep with tightness in the center of her chest around 630 this morning.  Pain has been constant since then and she initially presented to urgent care.  Her initial EKG there showed ST elevation inferiorly and EMS was called to bring patient to the ED.  Code STEMI was activated prior to arrival, patient given 324 mg of aspirin prior to arrival as well.  She states that her pain is improving but still present, rates it at a 4 out of 10.  She has never had similar symptoms in the past, states she was feeling fine when she went to bed last night.  She has not had any recent fevers, cough, shortness of breath, abdominal pain, pain or swelling in her legs.  She does report feeling nauseous with a couple episodes of vomiting since the onset of pain.        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   Ectopic pregnancy, tubal 12/11/2015   Encounter for sterilization 12/11/2015    Past Surgical History:  Procedure Laterality Date   CHOLECYSTECTOMY     DG GALL BLADDER     DIAGNOSTIC LAPAROSCOPY WITH REMOVAL OF ECTOPIC PREGNANCY N/A 12/11/2015   Procedure: DIAGNOSTIC LAPAROSCOPY WITH REMOVAL OF ECTOPIC PREGNANCY;  Surgeon: Conard Novak, MD;  Location: [facility] ORS;  Service: Gynecology;  Laterality: N/A;   DILATION AND CURETTAGE  OF UTERUS     LAPAROSCOPIC BILATERAL SALPINGECTOMY Bilateral 12/11/2015   Procedure: LAPAROSCOPIC BILATERAL SALPINGECTOMY;  Surgeon: Conard Novak, MD;  Location: [facility] ORS;  Service: Gynecology;  Laterality: Bilateral;    Prior to Admission medications   Medication Sig Start Date End Date Taking? Authorizing Provider  cyclobenzaprine (FLEXERIL) 10 MG tablet Take 1 tablet (10 mg total) by mouth at bedtime. Do not drive while taking as can cause drowsiness 11/09/19   Renford Dills, NP  rifampin (RIFADIN) 300 MG capsule Take 2 capsules (600 mg total) by mouth daily. 11/03/20   Lynn Ito, MD  TREMFYA 100 MG/ML SOPN Inject into the skin. 10/07/19   [provider]    Allergies Patient has no known allergies.  Family History  Problem Relation Age of Onset   Healthy Mother    Healthy Father     Social History Social History   Tobacco Use   Smoking status: Every Day    Packs/day: 1.00    Years: 15.00    Pack years: 15.00    Types: Cigarettes   Smokeless tobacco: Never  Substance Use Topics   Alcohol use: No   Drug use: No    Review of Systems  Constitutional: No fever/chills Eyes: No visual changes. ENT: No sore throat. Cardiovascular: Positive for chest pain. Respiratory: Denies shortness of breath. Gastrointestinal: No abdominal pain.  Positive for nausea and vomiting.  No diarrhea.  No  constipation. Genitourinary: Negative for dysuria. Musculoskeletal: Negative for back pain. Skin: Negative for rash. Neurological: Negative for headaches, focal weakness or numbness.  ____________________________________________   PHYSICAL EXAM:  VITAL SIGNS: ED Triage Vitals  Enc Vitals Group     BP 03/06/21 0857 111/82     Pulse Rate 03/06/21 0855 62     Resp 03/06/21 0855 14     Temp --      Temp Source 03/06/21 0857 Oral     SpO2 03/06/21 0855 99 %     Weight 03/06/21 0857 178 lb (80.7 kg)     Height 03/06/21 0857 5\' 4"  (1.626 m)     Head  Circumference --      Peak Flow --      Pain Score 03/06/21 0854 4     Pain Loc --      Pain Edu? --      Excl. in GC? --     Constitutional: Alert and oriented. Eyes: Conjunctivae are normal. Head: Atraumatic. Nose: No congestion/rhinnorhea. Mouth/Throat: Mucous membranes are moist. Neck: Normal ROM Cardiovascular: Normal rate, regular rhythm. Grossly normal heart sounds.  2+ radial pulses bilaterally. Respiratory: Normal respiratory effort.  No retractions. Lungs CTAB. Gastrointestinal: Soft and nontender. No distention. Genitourinary: deferred Musculoskeletal: No lower extremity tenderness nor edema. Neurologic:  Normal speech and language. No gross focal neurologic deficits are appreciated. Skin:  Skin is warm, dry and intact. No rash noted. Psychiatric: Mood and affect are normal. Speech and behavior are normal.  ____________________________________________   LABS (all labs ordered are listed, but only abnormal results are displayed)  Labs Reviewed  CBC WITH DIFFERENTIAL/PLATELET - Abnormal; Notable for the following components:      Result Value   WBC 18.3 (*)    Neutro Abs 14.6 (*)    Abs Immature Granulocytes 0.08 (*)    All other components within normal limits  RESP PANEL BY RT-PCR (FLU A&B, COVID) ARPGX2  HEMOGLOBIN A1C  PROTIME-INR  APTT  COMPREHENSIVE METABOLIC PANEL  LIPID PANEL  HCG, QUANTITATIVE, PREGNANCY  POC URINE PREG, ED  TROPONIN I (HIGH SENSITIVITY)   ____________________________________________  EKG  ED ECG REPORT I, 13/12/22, the attending physician, personally viewed and interpreted this ECG.   Date: 03/06/2021  EKG Time: 8:55  Rate: 69  Rhythm: normal sinus rhythm  Axis: Normal  Intervals:none  ST&T Change: ST elevation inferolaterally with ST depressions in I and aVL   PROCEDURES  Procedure(s) performed (including Critical Care):  .Critical Care Performed by: 13/03/2021, MD Authorized by: Chesley Noon, MD    Critical care provider statement:    Critical care time (minutes):  25   Critical care time was exclusive of:  Separately billable procedures and treating other patients and teaching time   Critical care was necessary to treat or prevent imminent or life-threatening deterioration of the following conditions:  Cardiac failure   Critical care was time spent personally by me on the following activities:  Ordering and performing treatments and interventions, ordering and review of laboratory studies, ordering and review of radiographic studies, discussions with consultants, pulse oximetry, re-evaluation of patient's condition, review of old charts, development of treatment plan with patient or surrogate, evaluation of patient's response to treatment, examination of patient and obtaining history from patient or surrogate   I assumed direction of critical care for this patient from another provider in my specialty: no     Care discussed with: admitting provider     ____________________________________________   INITIAL IMPRESSION /  ASSESSMENT AND PLAN / ED COURSE      40 year old female with no significant past medical history presents to the ED complaining of acute onset of chest tightness and pressure waking her from sleep around 630 this morning.  EKG from urgent care shows ST elevation inferiorly with lateral reciprocal changes.  EKG here in the ED seems to have progressed with worsening ST elevation inferiorly and developing ST elevation in V5 and V6.  Patient was given aspirin prior to arrival and we will give heparin bolus.  Cardiology is at the bedside and will plan to take patient to the Cath Lab.  She remains hemodynamically stable at this time.      ____________________________________________   FINAL CLINICAL IMPRESSION(S) / ED DIAGNOSES  Final diagnoses:  ST elevation myocardial infarction (STEMI), unspecified artery [facility])     ED Discharge Orders     None         Note:  This document was prepared using Dragon voice recognition software and may include unintentional dictation errors.    Chesley Noon, MD 03/06/21 [phone]

## 2021-03-06 NOTE — Progress Notes (Signed)
  Chaplain On-Call responded to Code STEMI notification at 0838 hours.  Chaplain learned from medical team that the patient is being transported to the Cath Lab for procedures.  Chaplain spoke with Unit Secretary Carlisle Beers, who will page the Chaplain if the patient's husband arrives.  Chaplain Evelena Peat M.Div., [facility]

## 2021-03-06 NOTE — Progress Notes (Incomplete)
Neuro: alert and oriented, moves easily and independently in bed, stable gait, some anxiety this evening-Xanex given Resp: Stable on room air CV: afebrile, multiple runs of Vtach with flutter in the chest and quick shortness of breath GIGU:used bedside commode until episodes of Vtach and chest pain, purewick utilized, no BM, Some nausea/vomiting x 2-zofran given x 2 Skin: clean and intact, gauze dressing on TR band site-removed with no complications Social: family visiting this afternoon/evening, all questions and concerns addressed  Events: STEMI from the ED-cath lab for a stent

## 2021-03-06 NOTE — ED Triage Notes (Signed)
Pt reports chest pain that woke her from sleep. Pt was sent from [facility] for STEMI. Pt was given 324 asa. STEMI was called en route by [facility].

## 2021-03-06 NOTE — ED Provider Notes (Addendum)
[facility]    CSN: [id number] Arrival date & time: 03/06/21  0801      History   Chief Complaint Chief Complaint  Patient presents with   Chest Pain    HPI Morgan Mcdowell is a 40 y.o. female presenting for substernal chest pain/pressure that woke her up from sleep this morning. Symptoms present x 1 hour, not worsening.  Pain does radiate down the left arm.  Symptoms associated with nausea and vomiting as well as fatigue.  Denies any dizziness or shortness of breath.  Patient has not taken any medication for symptoms.  Denies any similar problems in the past.  She says she is healthy.  She only takes rifampin for latent TB that she has been taking for the past couple of months.  Patient does smoke 1 pack/day for a long period of time.  Denies any alcohol use or illicit drug use.  Denies any family history of heart attack at a young age.  Patient denies any history of hypertension, hyperlipidemia, diabetes. Patient says that she had a checkup with her doctor yesterday and everything went well.  No other complaints.  Her husband is with her today.  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   Ectopic pregnancy, tubal 12/11/2015   Encounter for sterilization 12/11/2015    Past Surgical History:  Procedure Laterality Date   CHOLECYSTECTOMY     DG GALL BLADDER     DIAGNOSTIC LAPAROSCOPY WITH REMOVAL OF ECTOPIC PREGNANCY N/A 12/11/2015   Procedure: DIAGNOSTIC LAPAROSCOPY WITH REMOVAL OF ECTOPIC PREGNANCY;  Surgeon: Conard Novak, MD;  Location: [facility] ORS;  Service: Gynecology;  Laterality: N/A;   DILATION AND CURETTAGE OF UTERUS     LAPAROSCOPIC BILATERAL SALPINGECTOMY Bilateral 12/11/2015   Procedure:  LAPAROSCOPIC BILATERAL SALPINGECTOMY;  Surgeon: Conard Novak, MD;  Location: [facility] ORS;  Service: Gynecology;  Laterality: Bilateral;    OB History   No obstetric history on file.      Home Medications    Prior to Admission medications   Medication Sig Start Date End Date Taking? Authorizing Provider  cyclobenzaprine (FLEXERIL) 10 MG tablet Take 1 tablet (10 mg total) by mouth at bedtime. Do not drive while taking as can cause drowsiness 11/09/19   Renford Dills, NP  rifampin (RIFADIN) 300 MG capsule Take 2 capsules (600 mg total) by mouth daily. 11/03/20   Lynn Ito, MD  TREMFYA 100 MG/ML SOPN Inject into the skin. 10/07/19   [provider]    Family History Family History  Problem Relation Age of Onset   Healthy Mother    Healthy Father     Social History Social History   Tobacco Use   Smoking status: Every Day    Packs/day: 1.00    Years: 15.00    Pack years: 15.00    Types: Cigarettes   Smokeless tobacco: Never  Substance Use Topics   Alcohol use: No   Drug use: No     Allergies   Patient has no known allergies.   Review of Systems Review of Systems  Constitutional:  Positive for fatigue.  Respiratory:  Negative for shortness of breath.   Cardiovascular:  Positive for chest pain. Negative for palpitations.  Gastrointestinal:  Positive for nausea and vomiting. Negative for abdominal pain.  Neurological:  Negative for dizziness, weakness and headaches.    Physical Exam Triage Vital Signs ED Triage Vitals  Enc Vitals Group     BP 03/06/21 0816 (!) 144/84     Pulse Rate 03/06/21 0816 69     Resp 03/06/21 0816 18     Temp 03/06/21 0816 98.3 F (36.8 C)     Temp Source 03/06/21 0816 Oral     SpO2 03/06/21 0816 99 %     Weight 03/06/21 0817 178 lb (80.7 kg)     Height 03/06/21 0817 5\' 4"  (1.626 m)     Head Circumference --      Peak Flow --      Pain Score 03/06/21 0816 6     Pain Loc --      Pain Edu? --      Excl. in GC?  --    No data found.  Updated Vital Signs BP (!) 144/84 (BP Location: Left Arm)   Pulse 69   Temp 98.3 F (36.8 C) (Oral)   Resp 18   Ht 5\' 4"  (1.626 m)   Wt 178 lb (80.7 kg)   LMP 02/17/2021   SpO2 99%   BMI 30.55 kg/m      Physical Exam Vitals and nursing note reviewed.  Constitutional:      General: She is in acute distress (mild distress. Appears a little pale and is tearful).     Appearance: Normal appearance. She is well-developed. She is not ill-appearing or toxic-appearing.  HENT:     Head: Normocephalic and atraumatic.  Eyes:     General: No scleral icterus.       Right eye: No discharge.        Left eye: No discharge.     Conjunctiva/sclera: Conjunctivae normal.  Cardiovascular:     Rate and Rhythm: Normal rate and regular rhythm.     Heart sounds: Normal heart sounds.  Pulmonary:     Effort: Pulmonary effort is normal. No respiratory distress.     Breath sounds: Normal breath sounds.  Musculoskeletal:     Cervical back: Neck supple.  Skin:    General: Skin is dry.  Neurological:     General: No focal deficit present.     Mental Status: She is alert. Mental status is at baseline.     Motor: No weakness.     Coordination: Coordination normal.     Gait: Gait normal.  Psychiatric:        Mood and Affect: Mood normal.        Behavior: Behavior normal.        Thought Content: Thought content normal.     UC Treatments / Results  Labs (all labs ordered are listed, but only abnormal results are displayed) Labs Reviewed - No data to display  EKG   Radiology CARDIAC CATHETERIZATION  Result Date: 03/06/2021   Prox RCA lesion is 30% stenosed.   RPAV lesion is 100% stenosed.   2nd RPL lesion is 100% stenosed.   A drug-eluting stent was successfully placed using a STENT ONYX FRONTIER 2.5X12.   Post intervention, there is a 0% residual stenosis.   The left ventricular systolic function is normal.   LV end diastolic pressure is moderately elevated.   The  left ventricular ejection fraction is 50-55% by visual estimate. 1.  Severe one-vessel coronary artery disease with thrombotic occlusion of the proximal right posterior AV  groove branch of the right coronary artery.  In addition, the second posterolateral branch is occluded with evidence of thrombus in the terminal branch.  No other obstructive disease. 2.  Low normal LV systolic function with an EF of 50 to 55% with basal inferior wall hypokinesis.  Moderately elevated left ventricular end-diastolic pressure at 25 mmHg. 3.  Successful angioplasty and drug-eluting stent placement to the right posterior AV groove artery.  PL 2 was not treated given terminal occlusion and small vessel size. Recommendations: Dual antiplatelet therapy for at least 1 year. Aggressive treatment of risk factors. Smoking cessation is strongly advised and the patient was referred to cardiac rehab.   DG Chest Portable 1 View  Result Date: 03/06/2021 CLINICAL DATA:  Chest pain EXAM: PORTABLE CHEST 1 VIEW COMPARISON:  Chest radiograph dated October 23, 2020 FINDINGS: The heart size and mediastinal contours are within normal limits. Both lungs are clear. The visualized skeletal structures are unremarkable. IMPRESSION: No active disease. Electronically Signed   By: Larose Hires D.O.   On: 03/06/2021 09:20    Procedures ED EKG  Date/Time: 03/06/2021 8:37 AM Performed by: Shirlee Latch, PA-C Authorized by: Shirlee Latch, PA-C   ECG reviewed by ED Physician in the absence of a cardiologist: yes   Previous ECG:    Previous ECG:  Compared to current   Similarity:  Changes noted   Comparison ECG info:  ST changes. Compared to EKG from 2014 Interpretation:    Interpretation: abnormal   Rate:    ECG rate assessment: normal   Rhythm:    Rhythm: sinus rhythm   Ectopy:    Ectopy: none   QRS:    QRS axis:  Normal   QRS intervals:  Normal   QRS conduction: normal   ST segments:    ST segments:  Elevation   Elevation:  II, III  and aVF T waves:    T waves: normal   Comments:     NSR, ST elevation II, III, aVF (including critical care time)  Medications Ordered in UC Medications  aspirin chewable tablet 324 mg (324 mg Oral Given 03/06/21 0825)    Initial Impression / Assessment and Plan / UC Course  I have reviewed the triage vital signs and the nursing notes.  Pertinent labs & imaging results that were available during my care of the patient were reviewed by me and considered in my medical decision making (see chart for details).  40 year old female presenting for 1 hour history of substernal chest pain/pressure with radiation to left arm and associated nausea and vomiting.  Pain woke her up from sleep.  Vitals are stable.   Today, EKG does show ST elevation in leads II, III, aVF. Concerning findings for acute MI.   EMS contacted immediately.  Patient given four 81 mg chewable baby aspirin.  Report given to EMS.  EMS gains IV access and calls code STEMI team.  I did try to reach out to multiple members for cardiology on-call and was directed towards the STEMI team.  Contacted the STEMI number and was advised that EMS had called them and was sending the EKG to them.  Patient leaving in stable condition in route to [facility].   Final Clinical Impressions(s) / UC Diagnoses   Final diagnoses:  ST elevation myocardial infarction (STEMI), unspecified artery (HCC)  Chest pain, unspecified type     Discharge Instructions      You have been advised to follow up immediately in the emergency department  for concerning signs.symptoms. If you declined EMS transport, please have a family member take you directly to the ED at this time. Do not delay. Based on concerns about condition, if you do not follow up in th e ED, you may risk poor outcomes including worsening of condition, delayed treatment and potentially life threatening issues. If you have declined to go to the ED at this time, you should call your PCP immediately  to set up a follow up appointment.  Go to ED for red flag symptoms, including; fevers you cannot reduce with Tylenol/Motrin, severe headaches, vision changes, numbness/weakness in part of the body, lethargy, confusion, intractable vomiting, severe dehydration, chest pain, breathing difficulty, severe persistent abdominal or pelvic pain, signs of severe infection (increased redness, swelling of an area), feeling faint or passing out, dizziness, etc. You should especially go to the ED for sudden acute worsening of condition if you do not elect to go at this time.      ED Prescriptions   None    PDMP not reviewed this encounter.   Shirlee Latch, PA-C 03/06/21 1026    Eusebio Friendly B, PA-C 03/06/21 1027

## 2021-03-06 NOTE — Discharge Instructions (Addendum)

## 2021-03-06 NOTE — ED Triage Notes (Signed)
Pt started one hour PTA with chest pain, vomiting, and left arm pain.

## 2021-03-06 NOTE — ED Notes (Signed)
Pt woken from sleep with CP this morning. Pt was given 324 asa at urgent care.

## 2021-03-07 ENCOUNTER — Inpatient Hospital Stay (HOSPITAL_COMMUNITY)
Admit: 2021-03-07 | Discharge: 2021-03-07 | Disposition: A | Discharge status: Home / Self Care | Payer: BC Managed Care – PPO | Attending: Cardiovascular Disease | Admitting: Cardiovascular Disease

## 2021-03-07 DIAGNOSIS — I2510 Atherosclerotic heart disease of native coronary artery without angina pectoris: Secondary | ICD-10-CM

## 2021-03-07 DIAGNOSIS — I2119 ST elevation (STEMI) myocardial infarction involving other coronary artery of inferior wall: Secondary | ICD-10-CM | POA: Diagnosis not present

## 2021-03-07 DIAGNOSIS — I2583 Coronary atherosclerosis due to lipid rich plaque: Secondary | ICD-10-CM

## 2021-03-07 DIAGNOSIS — E7800 Pure hypercholesterolemia, unspecified: Secondary | ICD-10-CM

## 2021-03-07 LAB — CBC
HCT: 34.5 % — ABNORMAL LOW (ref 36.0–46.0)
Hemoglobin: 11.6 g/dL — ABNORMAL LOW (ref 12.0–15.0)
MCH: 30.9 pg (ref 26.0–34.0)
MCHC: 33.6 g/dL (ref 30.0–36.0)
MCV: 91.8 fL (ref 80.0–100.0)
Platelets: 293 10*3/uL (ref 150–400)
RBC: 3.76 MIL/uL — ABNORMAL LOW (ref 3.87–5.11)
RDW: 13.2 % (ref 11.5–15.5)
WBC: 14.5 10*3/uL — ABNORMAL HIGH (ref 4.0–10.5)
nRBC: 0 % (ref 0.0–0.2)

## 2021-03-07 LAB — ECHOCARDIOGRAM COMPLETE
AR max vel: 2.19 cm2
AV Peak grad: 5.5 mmHg
Ao pk vel: 1.17 m/s
Area-P 1/2: 3.93 cm2
Height: 64 in
S' Lateral: 3.7 cm
Weight: 2913.6 oz

## 2021-03-07 LAB — BASIC METABOLIC PANEL
Anion gap: 5 (ref 5–15)
BUN: 10 mg/dL (ref 6–20)
CO2: 22 mmol/L (ref 22–32)
Calcium: 8 mg/dL — ABNORMAL LOW (ref 8.9–10.3)
Chloride: 109 mmol/L (ref 98–111)
Creatinine, Ser: 0.49 mg/dL (ref 0.44–1.00)
GFR, Estimated: >60 mL/min (ref >60)
Glucose, Bld: 123 mg/dL — ABNORMAL HIGH (ref 70–99)
Potassium: 3.7 mmol/L (ref 3.5–5.1)
Sodium: 136 mmol/L (ref 135–145)

## 2021-03-07 LAB — HEPATIC FUNCTION PANEL
ALT: 43 U/L (ref 0–44)
AST: 119 U/L — ABNORMAL HIGH (ref 15–41)
Albumin: 3.2 g/dL — ABNORMAL LOW (ref 3.5–5.0)
Alkaline Phosphatase: 82 U/L (ref 38–126)
Bilirubin, Direct: <0.1 mg/dL (ref 0.0–0.2)
Total Bilirubin: 0.6 mg/dL (ref 0.3–1.2)
Total Protein: 6.9 g/dL (ref 6.5–8.1)

## 2021-03-07 MED ORDER — MORPHINE SULFATE (PF) 2 MG/ML IV SOLN
2.0000 mg | INTRAVENOUS | Status: DC | PRN
  Administered 2021-03-07 (×3): 2 mg via INTRAVENOUS
  Filled 2021-03-07 (×3): qty 1

## 2021-03-07 MED ORDER — METOPROLOL SUCCINATE ER 50 MG PO TB24: 25.0000 mg | ORAL_TABLET | Freq: Every day | ORAL | Status: DC

## 2021-03-07 MED ORDER — METOPROLOL TARTRATE 25 MG PO TABS: 12.5000 mg | ORAL_TABLET | Freq: Once | ORAL | Status: DC

## 2021-03-07 MED ORDER — SODIUM CHLORIDE 0.9 % IV SOLN
25.0000 mg | Freq: Once | INTRAVENOUS | Status: DC
  Filled 2021-03-07: qty 1

## 2021-03-07 MED ORDER — METOPROLOL SUCCINATE ER 50 MG PO TB24
25.0000 mg | ORAL_TABLET | Freq: Every day | ORAL | Status: DC
  Administered 2021-03-08: 25 mg via ORAL
  Filled 2021-03-07: qty 1

## 2021-03-07 MED ORDER — POTASSIUM CHLORIDE CRYS ER 20 MEQ PO TBCR
20.0000 meq | EXTENDED_RELEASE_TABLET | Freq: Once | ORAL | Status: AC
  Administered 2021-03-07: 20 meq via ORAL
  Filled 2021-03-07: qty 1

## 2021-03-07 NOTE — Progress Notes (Addendum)
Progress Note  Patient Name: Morgan Mcdowell Date of Encounter: 03/07/2021  [facility] Cardiologist: None   Subjective   Admitted with STEMI yesterday.  Cath showed 30% proximal RCA, occluded RPAV and second RPL was occluded distally due to embolization and this was too small to rescue>>status post PCI of the RPAV.  LV function was low normal with EF 50 to 55% by cath.  Complained of right arm pain at cath site last night resolved with morphine.  Denies any chest pain or shortness of breath.  She had several runs of nonsustained ventricular tachycardia on telemetry last night likely reperfusion arrhythmias.  Inpatient Medications    Scheduled Meds:  aspirin  324 mg Oral Once   aspirin  81 mg Oral Daily   atorvastatin  80 mg Oral Daily   Chlorhexidine Gluconate Cloth  6 each Topical Q0600   metoprolol tartrate  12.5 mg Oral BID   nicotine  21 mg Transdermal Daily   prasugrel  10 mg Oral Daily   rifampin  600 mg Oral Daily   sodium chloride flush  3 mL Intravenous Q12H   Continuous Infusions:  sodium chloride     sodium chloride     promethazine (PHENERGAN) injection (IM or IVPB)     PRN Meds: sodium chloride, acetaminophen, ALPRAZolam, morphine injection, ondansetron (ZOFRAN) IV, sodium chloride flush   Vital Signs    Vitals:   03/07/21 0400 03/07/21 0600 03/07/21 0700 03/07/21 0800  BP: 100/69 97/66 112/71 103/68  Pulse: 95 82 82 72  Resp: (!) 25 14 19 20   Temp: 98.2 F (36.8 C)     TempSrc: Oral   Oral  SpO2: 97% 98% 98% 96%  Weight:      Height:        Intake/Output Summary (Last 24 hours) at 03/07/2021 1000 Last data filed at 03/07/2021 0939 Gross per 24 hour  Intake 529.75 ml  Output 150 ml  Net 379.75 ml   Last 3 Weights 03/06/2021 03/06/2021 03/06/2021  Weight (lbs) 182 lb 1.6 oz 178 lb 178 lb  Weight (kg) 82.6 kg 80.74 kg 80.74 kg      Telemetry    NSR with several short runs of nonsustained ventricular tachycardia up to 16 beats in a row-  Personally Reviewed  ECG    No new EKG to review - Personally Reviewed  Physical Exam   GEN: No acute distress.   Neck: No JVD Cardiac: RRR, no murmurs, rubs, or gallops.  Respiratory: Clear to auscultation bilaterally. GI: Soft, nontender, non-distended  MS: No edema; No deformity. Neuro:  Nonfocal  Psych: Normal affect   Labs    High Sensitivity Troponin:   Recent Labs  Lab 03/06/21 0900 03/06/21 1031  TROPONINIHS 47* >24,000*      Chemistry Recent Labs  Lab 03/06/21 0944 03/07/21 0511  NA 126* 136  K 3.5 3.7  CL 100 109  CO2 19* 22  GLUCOSE 133* 123*  BUN 11 10  CREATININE 0.36* 0.49  CALCIUM 7.7* 8.0*  PROT 6.2* 6.9  ALBUMIN 2.8* 3.2*  AST 16 119*  ALT 19 43  ALKPHOS 71 82  BILITOT 0.3 0.6  GFRNONAA >60 >60  ANIONGAP 7 5     Hematology Recent Labs  Lab 03/06/21 0900 03/07/21 0511  WBC 18.3* 14.5*  RBC 4.35 3.76*  HGB 13.6 11.6*  HCT 41.0 34.5*  MCV 94.3 91.8  MCH 31.3 30.9  MCHC 33.2 33.6  RDW 13.3 13.2  PLT 310 293  BNPNo results for input(s): BNP, PROBNP in the last 168 hours.   DDimer No results for input(s): DDIMER in the last 168 hours.  Radiology    CARDIAC CATHETERIZATION  Result Date: 03/06/2021   Prox RCA lesion is 30% stenosed.   RPAV lesion is 100% stenosed.   2nd RPL lesion is 100% stenosed.   A drug-eluting stent was successfully placed using a STENT ONYX FRONTIER 2.5X12.   Post intervention, there is a 0% residual stenosis.   The left ventricular systolic function is normal.   LV end diastolic pressure is moderately elevated.   The left ventricular ejection fraction is 50-55% by visual estimate. 1.  Severe one-vessel coronary artery disease with thrombotic occlusion of the proximal right posterior AV groove branch of the right coronary artery.  In addition, the second posterolateral branch is occluded with evidence of thrombus in the terminal branch.  No other obstructive disease. 2.  Low normal LV systolic function with  an EF of 50 to 55% with basal inferior wall hypokinesis.  Moderately elevated left ventricular end-diastolic pressure at 25 mmHg. 3.  Successful angioplasty and drug-eluting stent placement to the right posterior AV groove artery.  PL 2 was not treated given terminal occlusion and small vessel size. Recommendations: Dual antiplatelet therapy for at least 1 year. Aggressive treatment of risk factors. Smoking cessation is strongly advised and the patient was referred to cardiac rehab.   DG Chest Portable 1 View  Result Date: 03/06/2021 CLINICAL DATA:  Chest pain EXAM: PORTABLE CHEST 1 VIEW COMPARISON:  Chest radiograph dated October 23, 2020 FINDINGS: The heart size and mediastinal contours are within normal limits. Both lungs are clear. The visualized skeletal structures are unremarkable. IMPRESSION: No active disease. Electronically Signed   By: Larose Hires D.O.   On: 03/06/2021 09:20    Cardiac Studies   Cardiac Cath 03/06/2021 Conclusion      Prox RCA lesion is 30% stenosed.   RPAV lesion is 100% stenosed.   2nd RPL lesion is 100% stenosed.   A drug-eluting stent was successfully placed using a STENT ONYX FRONTIER 2.5X12.   Post intervention, there is a 0% residual stenosis.   The left ventricular systolic function is normal.   LV end diastolic pressure is moderately elevated.   The left ventricular ejection fraction is 50-55% by visual estimate.   1.  Severe one-vessel coronary artery disease with thrombotic occlusion of the proximal right posterior AV groove branch of the right coronary artery.  In addition, the second posterolateral branch is occluded with evidence of thrombus in the terminal branch.  No other obstructive disease. 2.  Low normal LV systolic function with an EF of 50 to 55% with basal inferior wall hypokinesis.  Moderately elevated left ventricular end-diastolic pressure at 25 mmHg. 3.  Successful angioplasty and drug-eluting stent placement to the right posterior AV groove  artery.  PL 2 was not treated given terminal occlusion and small vessel size.   Recommendations: Dual antiplatelet therapy for at least 1 year. Aggressive treatment of risk factors. Smoking cessation is strongly advised and the patient was referred to cardiac rehab.    Coronary Diagrams  Diagnostic Dominance: Right Intervention    Patient Profile     40 y.o. 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  Assessment & Plan    Acute inferior ST elevation myocardial infarction:  -s/p emergent cath showing occluded posterior  AV groove with evidence of plaque rupture and large thrombus.  This was treated successfully with angioplasty and drug-eluting stent placement.  The second posterolateral branch was also occluded distally due to embolization and this was too small to rescue.   -Troponin peaked at greater than 24,000 -Recommend dual antiplatelet therapy for at least 12 months.   -Recommend aggressive treatment of risk factors and cardiac rehab. -Continue aspirin 81 mg daily, Effient 10 mg daily (did not use ticagrelor due to interaction with her rifampin), high-dose statin with atorvastatin 80 mg daily and tilidate Lopressor today to Toprol-XL 25 mg daily -2D echo is pending today  Tobacco use:  -Encourage cessation   3.  Hyperlipidemia  -LDL goal less than 70 -LDL was 172, HDL 22 on labs yesterday -AST mildly elevated due to MI but ALT is normal -Started on atorvastatin 80 mg daily -will need FLP and ALT in 6 weeks -HbA1c 6.2% this admission and she will need follow-up with her PCP  4.  Nonsustained ventricular tachycardia -Likely reperfusion arrhythmias in the first 24 hours post MI -Continue beta-blocker -will give K-Dur 20 mEq this a.m. for a potassium of 3.7.  Mag yesterday was 2.1 -Check BMet and mag in a.m.  5.  History of latent TB:  -She is on long-term  treatment with rifampin.    She is stable today to move out to telemetry floor.  Probable discharge in a.m. 02/05/2021  I have spent a total of 30 minutes with patient reviewing cardiac cath , telemetry, EKGs, labs and examining patient as well as establishing an assessment and plan that was discussed with the patient.  > 50% of time was spent in direct patient care.     For questions or updates, please contact [facility] Please consult [email or web address] for contact info under        Signed, Armanda Magic, MD  03/07/2021, 10:00 AM

## 2021-03-07 NOTE — Progress Notes (Signed)
*  PRELIMINARY RESULTS* Echocardiogram 2D Echocardiogram has been performed.  Morgan Mcdowell 03/07/2021, 4:13 PM

## 2021-03-07 NOTE — Plan of Care (Signed)
Neuro: alert and oriented, suffers from chronic lower back pain-morphine given for pain control-does not get relief from tylenol Resp: stable on room air CV: afebrile, vital signs stable, no further episodes of Vtach, no edema GIGU: using bedside commode independently, tolerating PO, emesis x 1-no further medication needed, free of nausea throughout the rest of the day Skin: clean and intact Social: family visiting throughout the day, all questions and concerns addressed, support expressed regarding making major lifestyle changes to improve health outcomes  Events: No events or deviations  Problem: Education: Goal: Knowledge of General Education information will improve Description: Including pain rating scale, medication(s)/side effects and non-pharmacologic comfort measures Outcome: Progressing   Problem: Health Behavior/Discharge Planning: Goal: Ability to manage health-related needs will improve Outcome: Progressing   Problem: Clinical Measurements: Goal: Ability to maintain clinical measurements within normal limits will improve Outcome: Progressing Goal: Will remain free from infection Outcome: Progressing Goal: Diagnostic test results will improve Outcome: Progressing Goal: Respiratory complications will improve Outcome: Progressing Goal: Cardiovascular complication will be avoided Outcome: Progressing   Problem: Activity: Goal: Risk for activity intolerance will decrease Outcome: Progressing   Problem: Nutrition: Goal: Adequate nutrition will be maintained Outcome: Progressing   Problem: Coping: Goal: Level of anxiety will decrease Outcome: Progressing   Problem: Elimination: Goal: Will not experience complications related to bowel motility Outcome: Progressing Goal: Will not experience complications related to urinary retention Outcome: Progressing   Problem: Pain Managment: Goal: General experience of comfort will improve Outcome: Progressing   Problem:  Safety: Goal: Ability to remain free from injury will improve Outcome: Progressing   Problem: Skin Integrity: Goal: Risk for impaired skin integrity will decrease Outcome: Progressing

## 2021-03-08 ENCOUNTER — Encounter: Payer: Self-pay | Admitting: Cardiovascular Disease

## 2021-03-08 ENCOUNTER — Telehealth: Payer: Self-pay | Admitting: Medical

## 2021-03-08 DIAGNOSIS — Z720 Tobacco use: Secondary | ICD-10-CM

## 2021-03-08 LAB — BASIC METABOLIC PANEL
Anion gap: 4 — ABNORMAL LOW (ref 5–15)
BUN: 12 mg/dL (ref 6–20)
CO2: 25 mmol/L (ref 22–32)
Calcium: 8.2 mg/dL — ABNORMAL LOW (ref 8.9–10.3)
Chloride: 108 mmol/L (ref 98–111)
Creatinine, Ser: 0.5 mg/dL (ref 0.44–1.00)
GFR, Estimated: >60 mL/min (ref >60)
Glucose, Bld: 104 mg/dL — ABNORMAL HIGH (ref 70–99)
Potassium: 4.1 mmol/L (ref 3.5–5.1)
Sodium: 137 mmol/L (ref 135–145)

## 2021-03-08 LAB — POCT ACTIVATED CLOTTING TIME
Activated Clotting Time: 242 seconds
Activated Clotting Time: 265 seconds

## 2021-03-08 LAB — MAGNESIUM: Magnesium: 2.2 mg/dL (ref 1.7–2.4)

## 2021-03-08 MED ORDER — PRASUGREL HCL 10 MG PO TABS: 10.0000 mg | ORAL_TABLET | Freq: Every day | ORAL | 3 refills | Status: DC

## 2021-03-08 MED ORDER — IOHEXOL 350 MG/ML SOLN
INTRAVENOUS | Status: DC | PRN
  Administered 2021-03-06: 80 mL

## 2021-03-08 MED ORDER — ATORVASTATIN CALCIUM 80 MG PO TABS: 80.0000 mg | ORAL_TABLET | Freq: Every day | ORAL | 3 refills | Status: DC

## 2021-03-08 MED ORDER — METOPROLOL SUCCINATE ER 25 MG PO TB24: 25.0000 mg | ORAL_TABLET | Freq: Every day | ORAL | 3 refills | Status: DC

## 2021-03-08 MED ORDER — ASPIRIN 81 MG PO CHEW: 81.0000 mg | CHEWABLE_TABLET | Freq: Every day | ORAL | 3 refills | Status: AC

## 2021-03-08 MED ORDER — NITROGLYCERIN 0.4 MG SL SUBL: 0.4000 mg | SUBLINGUAL_TABLET | SUBLINGUAL | 2 refills | Status: AC | PRN

## 2021-03-08 MED ORDER — NICOTINE 21 MG/24HR TD PT24: 21.0000 mg | MEDICATED_PATCH | Freq: Every day | TRANSDERMAL | 0 refills | Status: DC

## 2021-03-08 NOTE — Progress Notes (Signed)
Progress Note  Patient Name: Morgan Mcdowell Date of Encounter: 03/08/2021  [facility] Cardiologist: None   Subjective   She denies chest pain or shortness of breath.  She reports resolution of right arm discomfort at the cath site.  She seems to be very emotional about her presentation with myocardial infarction at young age.  She is determined to quit smoking but is afraid to fail. She had recurrent short runs of nonsustained ventricular tachycardia within 12 hours of revascularization but none since then.  Inpatient Medications    Scheduled Meds:  aspirin  81 mg Oral Daily   atorvastatin  80 mg Oral Daily   Chlorhexidine Gluconate Cloth  6 each Topical Q0600   metoprolol succinate  25 mg Oral Daily   nicotine  21 mg Transdermal Daily   prasugrel  10 mg Oral Daily   rifampin  600 mg Oral Daily   sodium chloride flush  3 mL Intravenous Q12H   Continuous Infusions:  sodium chloride     sodium chloride     PRN Meds: sodium chloride, acetaminophen, ALPRAZolam, morphine injection, ondansetron (ZOFRAN) IV, sodium chloride flush   Vital Signs    Vitals:   03/08/21 0430 03/08/21 0500 03/08/21 0700 03/08/21 0750  BP:  (!) 89/67 92/63   Pulse: 72 67 87   Resp: 12     Temp:   98.8 F (37.1 C) 98.8 F (37.1 C)  TempSrc:   Axillary Axillary  SpO2: 96% 97% 98%   Weight:      Height:        Intake/Output Summary (Last 24 hours) at 03/08/2021 0851 Last data filed at 03/08/2021 6948 Gross per 24 hour  Intake 246 ml  Output 600 ml  Net -354 ml    Last 3 Weights 03/06/2021 03/06/2021 03/06/2021  Weight (lbs) 182 lb 1.6 oz 178 lb 178 lb  Weight (kg) 82.6 kg 80.74 kg 80.74 kg      Telemetry    Normal sinus rhythm with no evidence of ventricular tachycardia in the last 24 hours.- Personally Reviewed  ECG    No new EKG to review - Personally Reviewed  Physical Exam   GEN: No acute distress.   Neck: No JVD Cardiac: RRR, no murmurs, rubs, or gallops.   Respiratory: Clear to auscultation bilaterally. GI: Soft, nontender, non-distended  MS: No edema; No deformity. Neuro:  Nonfocal  Psych: Normal affect  Right radial pulse is normal with no hematoma.  Labs    High Sensitivity Troponin:   Recent Labs  Lab 03/06/21 0900 03/06/21 1031  TROPONINIHS 47* >24,000*       Chemistry Recent Labs  Lab 03/06/21 0944 03/07/21 0511 03/08/21 0417  NA 126* 136 137  K 3.5 3.7 4.1  CL 100 109 108  CO2 19* 22 25  GLUCOSE 133* 123* 104*  BUN 11 10 12   CREATININE 0.36* 0.49 0.50  CALCIUM 7.7* 8.0* 8.2*  PROT 6.2* 6.9  --   ALBUMIN 2.8* 3.2*  --   AST 16 119*  --   ALT 19 43  --   ALKPHOS 71 82  --   BILITOT 0.3 0.6  --   GFRNONAA >60 >60 >60  ANIONGAP 7 5 4*      Hematology Recent Labs  Lab 03/06/21 0900 03/07/21 0511  WBC 18.3* 14.5*  RBC 4.35 3.76*  HGB 13.6 11.6*  HCT 41.0 34.5*  MCV 94.3 91.8  MCH 31.3 30.9  MCHC 33.2 33.6  RDW 13.3 13.2  PLT 310 293     BNPNo results for input(s): BNP, PROBNP in the last 168 hours.   DDimer No results for input(s): DDIMER in the last 168 hours.  Radiology    CARDIAC CATHETERIZATION  Result Date: 03/06/2021   Prox RCA lesion is 30% stenosed.   RPAV lesion is 100% stenosed.   2nd RPL lesion is 100% stenosed.   A drug-eluting stent was successfully placed using a STENT ONYX FRONTIER 2.5X12.   Post intervention, there is a 0% residual stenosis.   The left ventricular systolic function is normal.   LV end diastolic pressure is moderately elevated.   The left ventricular ejection fraction is 50-55% by visual estimate. 1.  Severe one-vessel coronary artery disease with thrombotic occlusion of the proximal right posterior AV groove branch of the right coronary artery.  In addition, the second posterolateral branch is occluded with evidence of thrombus in the terminal branch.  No other obstructive disease. 2.  Low normal LV systolic function with an EF of 50 to 55% with basal inferior wall  hypokinesis.  Moderately elevated left ventricular end-diastolic pressure at 25 mmHg. 3.  Successful angioplasty and drug-eluting stent placement to the right posterior AV groove artery.  PL 2 was not treated given terminal occlusion and small vessel size. Recommendations: Dual antiplatelet therapy for at least 1 year. Aggressive treatment of risk factors. Smoking cessation is strongly advised and the patient was referred to cardiac rehab.   DG Chest Portable 1 View  Result Date: 03/06/2021 CLINICAL DATA:  Chest pain EXAM: PORTABLE CHEST 1 VIEW COMPARISON:  Chest radiograph dated October 23, 2020 FINDINGS: The heart size and mediastinal contours are within normal limits. Both lungs are clear. The visualized skeletal structures are unremarkable. IMPRESSION: No active disease. Electronically Signed   By: Larose Hires D.O.   On: 03/06/2021 09:20   ECHOCARDIOGRAM COMPLETE  Result Date: 03/07/2021    ECHOCARDIOGRAM REPORT   Patient Name:   Morgan Mcdowell Date of Exam: 03/07/2021 Medical Rec #:  [id number]         Height:       64.0 in Accession #:    [id number]        Weight:       182.1 lb Date of Birth:  Jan 08, 1981         BSA:          1.880 m Patient Age:    40 years          BP:           128/102 mmHg Patient Gender: F                 HR:           70 bpm. Exam Location:  [facility] Procedure: 2D Echo, Cardiac Doppler and Color Doppler Indications:     Acute myocardial infarction, unspecified I21.9  History:         Patient has no prior history of Echocardiogram examinations.  Sonographer:     Neysa Bonito Roar Referring Phys:  [id number] A Zarius Furr Diagnosing Phys: Armanda Magic MD IMPRESSIONS  1. Left ventricular ejection fraction, by estimation, is 60 to 65%. The left ventricle has normal function. The left ventricle has no regional wall motion abnormalities. Left ventricular diastolic parameters were normal.  2. Right ventricular systolic function is normal. The right ventricular size is normal. Tricuspid  regurgitation signal is inadequate for assessing PA pressure.  3. The mitral valve is normal in structure.  Trivial mitral valve regurgitation. No evidence of mitral stenosis.  4. The aortic valve is normal in structure. Aortic valve regurgitation is not visualized. No aortic stenosis is present.  5. The inferior vena cava is normal in size with greater than 50% respiratory variability, suggesting right atrial pressure of 3 mmHg. FINDINGS  Left Ventricle: Left ventricular ejection fraction, by estimation, is 60 to 65%. The left ventricle has normal function. The left ventricle has no regional wall motion abnormalities. The left ventricular internal cavity size was normal in size. There is  no left ventricular hypertrophy. Left ventricular diastolic parameters were normal. Normal left ventricular filling pressure. Right Ventricle: The right ventricular size is normal. No increase in right ventricular wall thickness. Right ventricular systolic function is normal. Tricuspid regurgitation signal is inadequate for assessing PA pressure. Left Atrium: Left atrial size was normal in size. Right Atrium: Right atrial size was normal in size. Pericardium: There is no evidence of pericardial effusion. Mitral Valve: The mitral valve is normal in structure. Trivial mitral valve regurgitation. No evidence of mitral valve stenosis. Tricuspid Valve: The tricuspid valve is normal in structure. Tricuspid valve regurgitation is not demonstrated. No evidence of tricuspid stenosis. Aortic Valve: The aortic valve is normal in structure. Aortic valve regurgitation is not visualized. No aortic stenosis is present. Aortic valve peak gradient measures 5.5 mmHg. Pulmonic Valve: The pulmonic valve was normal in structure. Pulmonic valve regurgitation is not visualized. No evidence of pulmonic stenosis. Aorta: The aortic root is normal in size and structure. Venous: The inferior vena cava is normal in size with greater than 50% respiratory  variability, suggesting right atrial pressure of 3 mmHg. IAS/Shunts: No atrial level shunt detected by color flow Doppler.  LEFT VENTRICLE PLAX 2D LVIDd:         4.80 cm   Diastology LVIDs:         3.70 cm   LV e' medial:    10.00 cm/s LV PW:         1.00 cm   LV E/e' medial:  8.9 LV IVS:        1.00 cm   LV e' lateral:   8.49 cm/s LVOT diam:     2.00 cm   LV E/e' lateral: 10.5 LVOT Area:     3.14 cm  RIGHT VENTRICLE RV Mid diam:    2.40 cm RV S prime:     13.30 cm/s TAPSE (M-mode): 2.5 cm LEFT ATRIUM             Index        RIGHT ATRIUM           Index LA diam:        3.50 cm 1.86 cm/m   RA Area:     11.30 cm LA Vol (A2C):   39.9 ml 21.22 ml/m  RA Volume:   24.20 ml  12.87 ml/m LA Vol (A4C):   31.9 ml 16.97 ml/m LA Biplane Vol: 36.8 ml 19.58 ml/m  AORTIC VALVE                 PULMONIC VALVE AV Area (Vmax): 2.19 cm     PV Vmax:        0.96 m/s AV Vmax:        117.00 cm/s  PV Peak grad:   3.7 mmHg AV Peak Grad:   5.5 mmHg     RVOT Peak grad: 1 mmHg LVOT Vmax:      81.40 cm/s  AORTA Ao  Root diam: 2.80 cm MITRAL VALVE MV Area (PHT): 3.93 cm    SHUNTS MV Decel Time: 193 msec    Systemic Diam: 2.00 cm MV E velocity: 89.10 cm/s MV A velocity: 59.60 cm/s MV E/A ratio:  1.49 MV A Prime:    11.2 cm/s Armanda Magic MD Electronically signed by Armanda Magic MD Signature Date/Time: 03/07/2021/8:22:49 PM    Final     Cardiac Studies   Cardiac Cath 03/06/2021 Conclusion      Prox RCA lesion is 30% stenosed.   RPAV lesion is 100% stenosed.   2nd RPL lesion is 100% stenosed.   A drug-eluting stent was successfully placed using a STENT ONYX FRONTIER 2.5X12.   Post intervention, there is a 0% residual stenosis.   The left ventricular systolic function is normal.   LV end diastolic pressure is moderately elevated.   The left ventricular ejection fraction is 50-55% by visual estimate.   1.  Severe one-vessel coronary artery disease with thrombotic occlusion of the proximal right posterior AV groove branch of  the right coronary artery.  In addition, the second posterolateral branch is occluded with evidence of thrombus in the terminal branch.  No other obstructive disease. 2.  Low normal LV systolic function with an EF of 50 to 55% with basal inferior wall hypokinesis.  Moderately elevated left ventricular end-diastolic pressure at 25 mmHg. 3.  Successful angioplasty and drug-eluting stent placement to the right posterior AV groove artery.  PL 2 was not treated given terminal occlusion and small vessel size.   Recommendations: Dual antiplatelet therapy for at least 1 year. Aggressive treatment of risk factors. Smoking cessation is strongly advised and the patient was referred to cardiac rehab.    Coronary Diagrams  Diagnostic Dominance: Right Intervention    Patient Profile     40 y.o. 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  Assessment & Plan    Acute inferior ST elevation myocardial infarction:  -s/p emergent cath showing occluded posterior AV groove with evidence of plaque rupture and large thrombus.  This was treated successfully with angioplasty and drug-eluting stent placement.  The second posterolateral branch was also occluded distally due to embolization and this was too small to rescue.   -Troponin peaked at greater than 24,000 -Recommend dual antiplatelet therapy for at least 12 months.   -Recommend aggressive treatment of risk factors and cardiac rehab. -Continue aspirin 81 mg daily, Effient 10 mg daily (did not use ticagrelor due to interaction with her rifampin), high-dose statin with atorvastatin 80 mg daily and Toprol-XL 25 mg daily -No ACE inhibitor or ARB given normal LV systolic function and baseline low blood pressure.  Tobacco use:  -Encourage cessation .  She has Chantix at home.  3.  Hyperlipidemia  -LDL goal less than 70 -LDL was 172, HDL 22 during this  admission. -AST mildly elevated due to MI but ALT is normal -Started on atorvastatin 80 mg daily -will need FLP and ALT in 6 weeks -HbA1c 6.2% this admission and she will need follow-up with her PCP  4.  Nonsustained ventricular tachycardia -Likely reperfusion arrhythmias in the first 24 hours post MI -Continue beta-blocker -EF was normal and thus she does not require a wearable defibrillator.  5.  History of latent TB:  -She is on long-term treatment with rifampin.    I discussed with nursing and will have the patient walk around in  the ICU.  If no recurrent anginal dizziness, we will plan on discharging the patient later today. Recommend follow-up in our office in 1 to 2 weeks.  I have spent a total of 30 minutes with patient reviewing cardiac cath , telemetry, EKGs, labs and examining patient as well as establishing an assessment and plan that was discussed with the patient and nursing.  > 50% of time was spent in direct patient care.     For questions or updates, please contact [facility] Please consult [email or web address] for contact info under        Signed, Lorine Bears, MD  03/08/2021, 8:51 AM

## 2021-03-08 NOTE — Discharge Summary (Signed)
Discharge Summary    Patient ID: Morgan Mcdowell MRN: [id number]; DOB: August 13, 1980  Admit date: 03/06/2021 Discharge date: 03/08/2021  PCP:  Dortha Kern, MD   [facility] Providers Cardiologist: Dr. Kirke Corin  Discharge Diagnoses    Active Problems:   Positive QuantiFERON-TB Gold test   Acute ST elevation myocardial infarction (STEMI) of inferior wall (HCC)   Coronary artery disease due to lipid rich plaque   Pure hypercholesterolemia   Diagnostic Studies/Procedures    Cardiac Cath 03/06/2021 Conclusion       Prox RCA lesion is 30% stenosed.   RPAV lesion is 100% stenosed.   2nd RPL lesion is 100% stenosed.   A drug-eluting stent was successfully placed using a STENT ONYX FRONTIER 2.5X12.   Post intervention, there is a 0% residual stenosis.   The left ventricular systolic function is normal.   LV end diastolic pressure is moderately elevated.   The left ventricular ejection fraction is 50-55% by visual estimate.   1.  Severe one-vessel coronary artery disease with thrombotic occlusion of the proximal right posterior AV groove branch of the right coronary artery.  In addition, the second posterolateral branch is occluded with evidence of thrombus in the terminal branch.  No other obstructive disease. 2.  Low normal LV systolic function with an EF of 50 to 55% with basal inferior wall hypokinesis.  Moderately elevated left ventricular end-diastolic pressure at 25 mmHg. 3.  Successful angioplasty and drug-eluting stent placement to the right posterior AV groove artery.  PL 2 was not treated given terminal occlusion and small vessel size.   Recommendations: Dual antiplatelet therapy for at least 1 year. Aggressive treatment of risk factors. Smoking cessation is strongly advised and the patient was referred to cardiac rehab.     Coronary Diagrams   Diagnostic Dominance: Right Intervention     _____________   History of Present Illness     Morgan Mcdowell is  a 40 y.o. 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   Patient was admitted 11/12 for inferior STEMI. She presented with acute onset substernal chest pain and tightness that woke her up from sleep with associated vomiting. She went to an Urgent care where the EKG showed STE and thus EMS was called and CODE STEMI was activated.   Hospital Course     Consultants: None   In the ER the patient was still having 4/10 chest pain and EGK showed persistent ST elevation. Hs trop came back >24,000. She was given ASA and 4000 units of heparin. She was taken for emergent cardiac cath. Cardiac cath showed 30% pRCA, occluded RPAV and second RPL distally due to embolization and this was too small to rescue. She was treated with PCI to RPAV. Noted that LV function was low normal EF 50-55%. She had brief right arm pain at the cath site.Telemetry showed several runs of NSVT. Plan for DAPT with Effient and ASA for 12 months. She was started on high dose statin and Toprol. LDL came back at 172, HDL 22. AST mildly elevated. Tobacco cessation encouraged. Plan to discharge patient on Aspirin 81mg  daily, Effient 10mg  daily, Atorvastatin 80mg  daily, Toprol-XL 25mg  daily. Continue PTA rifampin at discharge.   The patient was evaluated by Dr. 03/08/21 and was felt to be stable for discharge.   Did the patient have an acute coronary syndrome (MI, NSTEMI, STEMI, etc) this admission?:  Yes  AHA/ACC Clinical Performance & Quality Measures: Aspirin prescribed? - Yes ADP Receptor Inhibitor (Plavix/Clopidogrel, Brilinta/Ticagrelor or Effient/Prasugrel) prescribed (includes medically managed patients)? - Yes Beta Blocker prescribed? - Yes High Intensity Statin (Lipitor 40-80mg  or Crestor 20-40mg ) prescribed? - Yes EF assessed during THIS hospitalization? - Yes For EF <40%, was ACEI/ARB prescribed? - Not Applicable (EF >/= 40%) For EF <40%,  Aldosterone Antagonist (Spironolactone or Eplerenone) prescribed? - Not Applicable (EF >/= 40%) Cardiac Rehab Phase II ordered (including medically managed patients)? - Yes       The patient will be scheduled for a TOC follow up appointment in 7-10 days.  A message has been sent to the [facility] and Scheduling Pool at the office where the patient should be seen for follow up.  _____________  Discharge Vitals Blood pressure 100/62, pulse 61, temperature 98.8 F (37.1 C), temperature source Axillary, resp. rate 15, height  (1.626 m), weight 82.6 kg, last menstrual period 02/17/2021, SpO2 100 %.  Filed Weights   03/06/21 0857 03/06/21 1024  Weight: 80.7 kg 82.6 kg    Labs & Radiologic Studies    CBC Recent Labs    03/06/21 0900 03/07/21 0511  WBC 18.3* 14.5*  NEUTROABS 14.6*  --   HGB 13.6 11.6*  HCT 41.0 34.5*  MCV 94.3 91.8  PLT 310 293   Basic Metabolic Panel Recent Labs    08/65/78 1548 03/07/21 0511 03/08/21 0417  NA  --  136 137  K  --  3.7 4.1  CL  --  109 108  CO2  --  22 25  GLUCOSE  --  123* 104*  BUN  --  10 12  CREATININE  --  0.49 0.50  CALCIUM  --  8.0* 8.2*  MG 2.1  --  2.2   Liver Function Tests Recent Labs    03/06/21 0944 03/07/21 0511  AST 16 119*  ALT 19 43  ALKPHOS 71 82  BILITOT 0.3 0.6  PROT 6.2* 6.9  ALBUMIN 2.8* 3.2*   No results for input(s): LIPASE, AMYLASE in the last 72 hours. High Sensitivity Troponin:   Recent Labs  Lab 03/06/21 0900 03/06/21 1031  TROPONINIHS 47* >24,000*    BNP Invalid input(s): POCBNP D-Dimer No results for input(s): DDIMER in the last 72 hours. Hemoglobin A1C Recent Labs    03/06/21 0900  HGBA1C 6.2*   Fasting Lipid Panel Recent Labs    03/06/21 0944  CHOL 211*  HDL 22*  LDLCALC 172*  TRIG 84  CHOLHDL 9.6   Thyroid Function Tests No results for input(s): TSH, T4TOTAL, T3FREE, THYROIDAB in the last 72 hours.  Invalid input(s): FREET3 _____________  CARDIAC  CATHETERIZATION  Result Date: 03/06/2021   Prox RCA lesion is 30% stenosed.   RPAV lesion is 100% stenosed.   2nd RPL lesion is 100% stenosed.   A drug-eluting stent was successfully placed using a STENT ONYX FRONTIER 2.5X12.   Post intervention, there is a 0% residual stenosis.   The left ventricular systolic function is normal.   LV end diastolic pressure is moderately elevated.   The left ventricular ejection fraction is 50-55% by visual estimate. 1.  Severe one-vessel coronary artery disease with thrombotic occlusion of the proximal right posterior AV groove branch of the right coronary artery.  In addition, the second posterolateral branch is occluded with evidence of thrombus in the terminal branch.  No other obstructive disease. 2.  Low normal LV systolic function with an EF of 50 to 55% with  basal inferior wall hypokinesis.  Moderately elevated left ventricular end-diastolic pressure at 25 mmHg. 3.  Successful angioplasty and drug-eluting stent placement to the right posterior AV groove artery.  PL 2 was not treated given terminal occlusion and small vessel size. Recommendations: Dual antiplatelet therapy for at least 1 year. Aggressive treatment of risk factors. Smoking cessation is strongly advised and the patient was referred to cardiac rehab.   DG Chest Portable 1 View  Result Date: 03/06/2021 CLINICAL DATA:  Chest pain EXAM: PORTABLE CHEST 1 VIEW COMPARISON:  Chest radiograph dated October 23, 2020 FINDINGS: The heart size and mediastinal contours are within normal limits. Both lungs are clear. The visualized skeletal structures are unremarkable. IMPRESSION: No active disease. Electronically Signed   By: Larose Hires D.O.   On: 03/06/2021 09:20   ECHOCARDIOGRAM COMPLETE  Result Date: 03/07/2021    ECHOCARDIOGRAM REPORT   Patient Name:   Morgan Mcdowell Date of Exam: 03/07/2021 Medical Rec #:  [id number]         Height:       64.0 in Accession #:    [id number]        Weight:       182.1 lb Date  of Birth:  Jun 28, 1980         BSA:          1.880 m Patient Age:    40 years          BP:           128/102 mmHg Patient Gender: F                 HR:           70 bpm. Exam Location:  [facility] Procedure: 2D Echo, Cardiac Doppler and Color Doppler Indications:     Acute myocardial infarction, unspecified I21.9  History:         Patient has no prior history of Echocardiogram examinations.  Sonographer:     Neysa Bonito Roar Referring Phys:  [id number] A ARIDA Diagnosing Phys: Armanda Magic MD IMPRESSIONS  1. Left ventricular ejection fraction, by estimation, is 60 to 65%. The left ventricle has normal function. The left ventricle has no regional wall motion abnormalities. Left ventricular diastolic parameters were normal.  2. Right ventricular systolic function is normal. The right ventricular size is normal. Tricuspid regurgitation signal is inadequate for assessing PA pressure.  3. The mitral valve is normal in structure. Trivial mitral valve regurgitation. No evidence of mitral stenosis.  4. The aortic valve is normal in structure. Aortic valve regurgitation is not visualized. No aortic stenosis is present.  5. The inferior vena cava is normal in size with greater than 50% respiratory variability, suggesting right atrial pressure of 3 mmHg. FINDINGS  Left Ventricle: Left ventricular ejection fraction, by estimation, is 60 to 65%. The left ventricle has normal function. The left ventricle has no regional wall motion abnormalities. The left ventricular internal cavity size was normal in size. There is  no left ventricular hypertrophy. Left ventricular diastolic parameters were normal. Normal left ventricular filling pressure. Right Ventricle: The right ventricular size is normal. No increase in right ventricular wall thickness. Right ventricular systolic function is normal. Tricuspid regurgitation signal is inadequate for assessing PA pressure. Left Atrium: Left atrial size was normal in size. Right Atrium: Right atrial  size was normal in size. Pericardium: There is no evidence of pericardial effusion. Mitral Valve: The mitral valve is normal in structure. Trivial mitral valve regurgitation. No  evidence of mitral valve stenosis. Tricuspid Valve: The tricuspid valve is normal in structure. Tricuspid valve regurgitation is not demonstrated. No evidence of tricuspid stenosis. Aortic Valve: The aortic valve is normal in structure. Aortic valve regurgitation is not visualized. No aortic stenosis is present. Aortic valve peak gradient measures 5.5 mmHg. Pulmonic Valve: The pulmonic valve was normal in structure. Pulmonic valve regurgitation is not visualized. No evidence of pulmonic stenosis. Aorta: The aortic root is normal in size and structure. Venous: The inferior vena cava is normal in size with greater than 50% respiratory variability, suggesting right atrial pressure of 3 mmHg. IAS/Shunts: No atrial level shunt detected by color flow Doppler.  LEFT VENTRICLE PLAX 2D LVIDd:         4.80 cm   Diastology LVIDs:         3.70 cm   LV e' medial:    10.00 cm/s LV PW:         1.00 cm   LV E/e' medial:  8.9 LV IVS:        1.00 cm   LV e' lateral:   8.49 cm/s LVOT diam:     2.00 cm   LV E/e' lateral: 10.5 LVOT Area:     3.14 cm  RIGHT VENTRICLE RV Mid diam:    2.40 cm RV S prime:     13.30 cm/s TAPSE (M-mode): 2.5 cm LEFT ATRIUM             Index        RIGHT ATRIUM           Index LA diam:        3.50 cm 1.86 cm/m   RA Area:     11.30 cm LA Vol (A2C):   39.9 ml 21.22 ml/m  RA Volume:   24.20 ml  12.87 ml/m LA Vol (A4C):   31.9 ml 16.97 ml/m LA Biplane Vol: 36.8 ml 19.58 ml/m  AORTIC VALVE                 PULMONIC VALVE AV Area (Vmax): 2.19 cm     PV Vmax:        0.96 m/s AV Vmax:        117.00 cm/s  PV Peak grad:   3.7 mmHg AV Peak Grad:   5.5 mmHg     RVOT Peak grad: 1 mmHg LVOT Vmax:      81.40 cm/s  AORTA Ao Root diam: 2.80 cm MITRAL VALVE MV Area (PHT): 3.93 cm    SHUNTS MV Decel Time: 193 msec    Systemic Diam: 2.00 cm MV E  velocity: 89.10 cm/s MV A velocity: 59.60 cm/s MV E/A ratio:  1.49 MV A Prime:    11.2 cm/s Armanda Magic MD Electronically signed by Armanda Magic MD Signature Date/Time: 03/07/2021/8:22:49 PM    Final    Disposition   Pt is being discharged home today in good condition.  Follow-up Plans & Appointments     Discharge Instructions     AMB Referral to Cardiac Rehabilitation - Phase II   Complete by: As directed    Diagnosis:  Coronary Stents STEMI     After initial evaluation and assessments completed: Virtual Based Care may be provided alone or in conjunction with Phase 2 Cardiac Rehab based on patient barriers.: Yes       Discharge Medications   Allergies as of 03/08/2021   No Known Allergies      Medication List  TAKE these medications    aspirin 81 MG chewable tablet Chew 1 tablet (81 mg total) by mouth daily. Start taking on: March 09, 2021   atorvastatin 80 MG tablet Commonly known as: LIPITOR Take 1 tablet (80 mg total) by mouth daily. Start taking on: March 09, 2021   cyclobenzaprine 10 MG tablet Commonly known as: FLEXERIL Take 1 tablet (10 mg total) by mouth at bedtime. Do not drive while taking as can cause drowsiness   metoprolol succinate 25 MG 24 hr tablet Commonly known as: TOPROL-XL Take 1 tablet (25 mg total) by mouth daily. Take with or immediately following a meal. Start taking on: March 09, 2021   nicotine 21 mg/24hr patch Commonly known as: NICODERM CQ - dosed in mg/24 hours Place 1 patch (21 mg total) onto the skin daily. Start taking on: March 09, 2021   nitroGLYCERIN 0.4 MG SL tablet Commonly known as: Nitrostat Place 1 tablet (0.4 mg total) under the tongue every 5 (five) minutes as needed for chest pain.   prasugrel 10 MG Tabs tablet Commonly known as: EFFIENT Take 1 tablet (10 mg total) by mouth daily. Start taking on: March 09, 2021   rifampin 300 MG capsule Commonly known as: RIFADIN Take 2 capsules (600 mg  total) by mouth daily.   Tremfya 100 MG/ML Sopn Generic drug: Guselkumab Inject into the skin.           Outstanding Labs/Studies   N/A  Duration of Discharge Encounter   Greater than 30 minutes including physician time.  Signed, Nael Petrosyan David Stall, PA-C 03/08/2021, 12:41 PM

## 2021-03-08 NOTE — Progress Notes (Signed)
   03/08/21 1045  Clinical Encounter Type  Visited With Patient and family together  Visit Type Initial;Spiritual support;Social support  Referral From Chaplain  Consult/Referral To Chaplain   Chaplain visited PT and husband who at bedside to offer emotional support. PT spoke of the great experience she had at the hospital, and stated she would be discharged today. Chaplain also checked in with PT's husband and made space for him also to express his emotions. Chaplain wished her best on her healing journey, and stated chaplains are available if ever needed.  Posey Boyer, M. Div.

## 2021-03-08 NOTE — Telephone Encounter (Signed)
TOC- awaiting scheduling.

## 2021-03-08 NOTE — Telephone Encounter (Signed)
Furth, Cadence H, PA-C  [facility] Triage; [facility] Scheduling PT admitted for STEMI, needs TOC follow-up and phone call with in 7-10 days. Thanks.

## 2021-03-08 NOTE — Progress Notes (Signed)
Patient alert and oriented x4., in NSR, on room air. Patient ambulated to [facility] in room and semi circle around unit. Tolerates well. Medication and discharge instructions provided to patient.

## 2021-03-10 NOTE — Telephone Encounter (Signed)
Patient called this morning to schedule tcm fu and to discuss dc instructions.  Pending approval with Fransico Michael to [facility] schedule next week for appt.

## 2021-03-10 NOTE — Telephone Encounter (Signed)
Transition Care Management Follow-up Telephone Call Date of discharge and from where: Monday 03/08/21 How have you been since you were released from the hospital? Doing well Any questions or concerns? Yes- diarrhea/ watery stool since discharge and questions if this is medication related  Items Reviewed: Did the pt receive and understand the discharge instructions provided? Yes  Medications obtained and verified? Yes  Other? No  Any new allergies since your discharge?  Possible medication intolerance- watery stool since discharge Dietary orders reviewed? Yes Do you have support at home? Yes   Home Care and Equipment/Supplies: Were home health services ordered? no If so, what is the name of the agency? N/a  Has the agency set up a time to come to the patient's home? not applicable Were any new equipment or medical supplies ordered?  No What is the name of the medical supply agency? N/a Were you able to get the supplies/equipment? not applicable Do you have any questions related to the use of the equipment or supplies? No  Functional Questionnaire: (I = Independent and D = Dependent) ADLs: I  Bathing/Dressing- I  Meal Prep- I  Eating- I  Maintaining continence- I  Transferring/Ambulation- I  Managing Meds- I  Follow up appointments reviewed:  PCP Hospital f/u appt confirmed? No  - encouraged to call Dr. Quillian Quince to advise of her recent coronary event and medication management for ongoing back pain that has been previously treated with NSAIDS Specialist Hospital f/u appt confirmed? Yes  Scheduled to see Ward Givens, NP on Thursday 03/11/21 @ 11:20 am. Are transportation arrangements needed? No  If their condition worsens, is the pt aware to call PCP or go to the Emergency Dept.? Yes Was the patient provided with contact information for the PCP's office or ED? No Was to pt encouraged to call back with questions or concerns? Yes

## 2021-03-10 NOTE — Telephone Encounter (Signed)
Scheduled 11/17 at 1120 am [facility] for tcm appt

## 2021-03-11 ENCOUNTER — Other Ambulatory Visit: Payer: Self-pay

## 2021-03-11 ENCOUNTER — Ambulatory Visit: Payer: BC Managed Care – PPO | Admitting: Nurse Practitioner

## 2021-03-11 ENCOUNTER — Encounter: Payer: Self-pay | Admitting: *Deleted

## 2021-03-11 ENCOUNTER — Encounter: Payer: Self-pay | Admitting: Nurse Practitioner

## 2021-03-11 VITALS — BP 100/70 | HR 64 | Ht 64.0 in | Wt 171.5 lb

## 2021-03-11 DIAGNOSIS — I2583 Coronary atherosclerosis due to lipid rich plaque: Secondary | ICD-10-CM | POA: Diagnosis not present

## 2021-03-11 DIAGNOSIS — E785 Hyperlipidemia, unspecified: Secondary | ICD-10-CM | POA: Diagnosis not present

## 2021-03-11 DIAGNOSIS — I2119 ST elevation (STEMI) myocardial infarction involving other coronary artery of inferior wall: Secondary | ICD-10-CM | POA: Diagnosis not present

## 2021-03-11 DIAGNOSIS — Z720 Tobacco use: Secondary | ICD-10-CM

## 2021-03-11 DIAGNOSIS — I2510 Atherosclerotic heart disease of native coronary artery without angina pectoris: Secondary | ICD-10-CM

## 2021-03-11 MED ORDER — METOPROLOL SUCCINATE ER 25 MG PO TB24: 12.5000 mg | ORAL_TABLET | Freq: Every day | ORAL | 3 refills | Status: DC

## 2021-03-11 NOTE — Patient Instructions (Addendum)
Medication Instructions:  Your physician has recommended you make the following change in your medication:   DECREASE Metoprolol succinate (Toprol XL) 25 mg and take one half tablet (12.5 mg) once a day  *If you need a refill on your cardiac medications before your next appointment, please call your pharmacy*   Lab Work: None  If you have labs (blood work) drawn today and your tests are completely normal, you will receive your results only by: MyChart Message (if you have MyChart) OR A paper copy in the mail If you have any lab test that is abnormal or we need to change your treatment, we will call you to review the results.   Testing/Procedures: None   Follow-Up: At [facility], you and your health needs are our priority.  As part of our continuing mission to provide you with exceptional heart care, we have created designated Provider Care Teams.  These Care Teams include your primary Cardiologist (physician) and Advanced Practice Providers (APPs -  Physician Assistants and Nurse Practitioners) who all work together to provide you with the care you need, when you need it.  We recommend signing up for the patient portal called "MyChart".  Sign up information is provided on this After Visit Summary.  MyChart is used to connect with patients for Virtual Visits (Telemedicine).  Patients are able to view lab/test results, encounter notes, upcoming appointments, etc.  Non-urgent messages can be sent to your provider as well.   To learn more about what you can do with MyChart, go to [email or web address].    Your next appointment:   1 month(s)  The format for your next appointment:   In Person  Provider:   Lorine Bears, MD or Nicolasa Ducking, NP

## 2021-03-11 NOTE — Progress Notes (Signed)
Patient is doing well without angina or dyspnea.  I expect her to be ready for cardiac rehabilitation within the next 2 weeks.

## 2021-03-11 NOTE — Progress Notes (Signed)
Office Visit    Patient Name: Morgan Mcdowell Date of Encounter: 03/11/2021  Primary Care Provider:  Dortha Kern, MD Primary Cardiologist:  Lorine Bears, MD  Chief Complaint    40 year old female with a history of tobacco abuse, psoriasis, latent tuberculosis, and GERD, who presents for follow-up after recent hospitalization for inferior STEMI and stenting of the RPAV.  Past Medical History    Past Medical History:  Diagnosis Date   CAD (coronary artery disease)    a. 02/2021 Inf STEMI/PCI: LM nl, LCX nl, OM1/2/3 nl, RCA 30p, RPAV 100 (2.5x12 Onyx Frontier DES), RPL1 nl, RPL2 100. EF 50-55%.   Ectopic pregnancy    GERD (gastroesophageal reflux disease)    OCC-TUMS PRN   History of echocardiogram    a. 02/2021 Echo: EF 60-65%, no nrwma, nl RV size/fxn, triv MR.   Hyperlipidemia LDL goal <70    Latent tuberculosis by blood test    Psoriasis    Tobacco abuse    Past Surgical History:  Procedure Laterality Date   CHOLECYSTECTOMY     CORONARY/GRAFT ACUTE MI REVASCULARIZATION N/A 03/06/2021   Procedure: Coronary/Graft Acute MI Revascularization;  Surgeon: Iran Ouch, MD;  Location: [facility];  Service: Cardiovascular;  Laterality: N/A;   DG GALL BLADDER     DIAGNOSTIC LAPAROSCOPY WITH REMOVAL OF ECTOPIC PREGNANCY N/A 12/11/2015   Procedure: DIAGNOSTIC LAPAROSCOPY WITH REMOVAL OF ECTOPIC PREGNANCY;  Surgeon: Conard Novak, MD;  Location: [facility];  Service: Gynecology;  Laterality: N/A;   DILATION AND CURETTAGE OF UTERUS     LAPAROSCOPIC BILATERAL SALPINGECTOMY Bilateral 12/11/2015   Procedure: LAPAROSCOPIC BILATERAL SALPINGECTOMY;  Surgeon: Conard Novak, MD;  Location: [facility];  Service: Gynecology;  Laterality: Bilateral;   LEFT HEART CATH AND CORONARY ANGIOGRAPHY N/A 03/06/2021   Procedure: LEFT HEART CATH AND CORONARY ANGIOGRAPHY;  Surgeon: Iran Ouch, MD;  Location: [facility];  Service: Cardiovascular;  Laterality: N/A;     Allergies  No Known Allergies  History of Present Illness    40 year old female with a history of tobacco abuse, psoriasis, latent tuberculosis, and GERD.  She was in her usual state of health until the morning of November 12, when she awoke with substernal chest pain associated with nausea and vomiting.  She initially went to urgent care where ECG showed inferior ST segment elevation.  She was transported to [facility] where emergent diagnostic catheterization revealed an occlusion of the RPAV and otherwise nonobstructive disease.  The RPAV was successfully treated with a drug-eluting stent.  She had residual occlusion of the RPL2, which was not felt to be amenable to PCI due to size.  She eventually peaked her troponin at greater than 24,000.  An echocardiogram showed an EF of 60 to 65% without regional wall motion abnormalities and trivial MR.  She was placed on aspirin, statin, prasugrel, and low-dose beta-blocker therapy.  Blood pressure was soft during hospitalization.  She was subsequently discharged earlier this week.  Since discharge, she has noted occasional orthostatic lightheadedness.  She has also had some chest wall soreness but denies any recurrence of angina, dyspnea, palpitations, PND, orthopnea, dizziness, syncope, edema, or early satiety.  She has not smoked since discharge.  She is interested in cardiac rehabilitation.  Home Medications    Current Outpatient Medications  Medication Sig Dispense Refill   aspirin 81 MG chewable tablet Chew 1 tablet (81 mg total) by mouth daily. 90 tablet 3   atorvastatin (LIPITOR) 80  MG tablet Take 1 tablet (80 mg total) by mouth daily. 90 tablet 3   nitroGLYCERIN (NITROSTAT) 0.4 MG SL tablet Place 1 tablet (0.4 mg total) under the tongue every 5 (five) minutes as needed for chest pain. 25 tablet 2   prasugrel (EFFIENT) 10 MG TABS tablet Take 1 tablet (10 mg total) by mouth daily. 90 tablet 3   rifampin (RIFADIN) 300 MG capsule Take 2  capsules (600 mg total) by mouth daily. 60 capsule 3   cyclobenzaprine (FLEXERIL) 10 MG tablet Take 1 tablet (10 mg total) by mouth at bedtime. Do not drive while taking as can cause drowsiness (Patient not taking: Reported on 03/11/2021) 15 tablet 0   metoprolol succinate (TOPROL-XL) 25 MG 24 hr tablet Take 0.5 tablets (12.5 mg total) by mouth daily. Take with or immediately following a meal. 45 tablet 3   nicotine (NICODERM CQ - DOSED IN MG/24 HOURS) 21 mg/24hr patch Place 1 patch (21 mg total) onto the skin daily. (Patient not taking: Reported on 03/11/2021) 28 patch 0   TREMFYA 100 MG/ML SOPN Inject into the skin. (Patient not taking: Reported on 03/11/2021)     No current facility-administered medications for this visit.     Review of Systems    Some orthostatic lightheadedness and chest wall soreness.  Otherwise, no angina, dyspnea, palpitation, PND, orthopnea, dizziness, syncope, edema, or early satiety.  All other systems reviewed and are otherwise negative except as noted above.   Cardiac Rehabilitation Eligibility Assessment  The patient is ready to start cardiac rehabilitation from a cardiac standpoint.    Physical Exam    VS:  BP 100/70 (BP Location: Left Arm, Patient Position: Sitting, Cuff Size: Normal)   Pulse 64   Ht 5\' 4"  (1.626 m)   Wt 171 lb 8 oz (77.8 kg)   LMP 02/17/2021   SpO2 98%   BMI 29.44 kg/m  , BMI Body mass index is 29.44 kg/m.     GEN: Well nourished, well developed, in no acute distress. HEENT: normal. Neck: Supple, no JVD, carotid bruits, or masses. Cardiac: RRR, no murmurs, rubs, or gallops. No clubbing, cyanosis, edema.  Radials/PT 2+ and equal bilaterally.  Right radial catheterization site without bleeding, bruit, or hematoma. Respiratory:  Respirations regular and unlabored, clear to auscultation bilaterally. GI: Soft, nontender, nondistended, BS + x 4. MS: no deformity or atrophy. Skin: warm and dry, no rash. Neuro:  Strength and sensation  are intact. Psych: Normal affect.  Accessory Clinical Findings    ECG personally reviewed by me today -regular sinus rhythm/sinus arrhythmia, 64, left atrial lodgment, prior inferior infarct- no acute changes.  Lab Results  Component Value Date   WBC 14.5 (H) 03/07/2021   HGB 11.6 (L) 03/07/2021   HCT 34.5 (L) 03/07/2021   MCV 91.8 03/07/2021   PLT 293 03/07/2021   Lab Results  Component Value Date   CREATININE 0.50 03/08/2021   BUN 12 03/08/2021   NA 137 03/08/2021   K 4.1 03/08/2021   CL 108 03/08/2021   CO2 25 03/08/2021   Lab Results  Component Value Date   ALT 43 03/07/2021   AST 119 (H) 03/07/2021   ALKPHOS 82 03/07/2021   BILITOT 0.6 03/07/2021   Lab Results  Component Value Date   CHOL 211 (H) 03/06/2021   HDL 22 (L) 03/06/2021   LDLCALC 172 (H) 03/06/2021   TRIG 84 03/06/2021   CHOLHDL 9.6 03/06/2021    Lab Results  Component Value Date   HGBA1C  6.2 (H) 03/06/2021    Assessment & Plan    1.  Inferior STEMI, subsequent episode of care/CAD: Status post hospitalization for chest pain and inferior ST elevation.  Found to have an occluded RPAV which was successfully treated with a drug-eluting stent.  She has residual occlusive disease of the RPL 2, which is being medically managed given small size.  She has not had any angina since discharge but has had some orthostatic lightheadedness.  I am going to reduce her metoprolol XL to 12.5 mg daily.  She otherwise remains on aspirin, high potency statin, and prasugrel therapy.  She does plan to participate in cardiac rehabilitation.  2.  Hyperlipidemia: Total cholesterol 211, LDL of 172, with an HDL of 22.  Continue [place] statin therapy.  Discussed importance of regular exercise and weight management.  3.  Tobacco abuse: Patient has not smoked since discharge.  She was smoking a pack and a half a day prior to this.  Congratulated her on stopping encouraged her to remain off of cigarettes going forward.  4.   Latent tuberculosis: Patient currently on a 78-month course of rifampin.  5.  Disposition: Follow-up in 1 month or sooner if necessary.  We will plan lipids and LFTs at follow-up.   Nicolasa Ducking, NP 03/11/2021, 1:31 PM

## 2021-03-17 ENCOUNTER — Other Ambulatory Visit: Payer: Self-pay

## 2021-03-17 ENCOUNTER — Encounter: Payer: Self-pay | Admitting: *Deleted

## 2021-03-17 ENCOUNTER — Encounter: Payer: BC Managed Care – PPO | Attending: Cardiovascular Disease | Admitting: *Deleted

## 2021-03-17 DIAGNOSIS — I213 ST elevation (STEMI) myocardial infarction of unspecified site: Secondary | ICD-10-CM

## 2021-03-17 DIAGNOSIS — Z955 Presence of coronary angioplasty implant and graft: Secondary | ICD-10-CM

## 2021-03-17 NOTE — Progress Notes (Signed)
Initial telephone orientation completed. Diagnosis can be found in [facility] 11/12. EP orientation scheduled for Thursday 12/8 at 2pm.

## 2021-03-30 ENCOUNTER — Telehealth: Payer: Self-pay | Admitting: Nurse Practitioner

## 2021-03-30 NOTE — Telephone Encounter (Signed)
Patient states since last Friday she has awoke with her legs pain and tingling and would like to discuss this. States it feels like needles all over her legs. Please call.

## 2021-03-30 NOTE — Telephone Encounter (Signed)
Spoke with patient and reviewed her concerns. She reports that when sleeping she wakes up with bilateral leg pain and tingling. Inquired if she has lower back pain and she reports having sciatica and reviewed that can be the source. She has not reached out to her PCP and recommended she check with them. Patient also has appointment next Friday here in our office for follow up. Instructed her to mention this at that visit so provider can assess and determine if additional testing is needed. Advised if her symptoms should worsen to let us know and confirmed her upcoming appointment. She was agreeable with this plan with no further questions at this time.

## 2021-04-01 ENCOUNTER — Encounter: Payer: BC Managed Care – PPO | Attending: Cardiovascular Disease

## 2021-04-01 ENCOUNTER — Other Ambulatory Visit: Payer: Self-pay

## 2021-04-01 VITALS — Ht 65.0 in | Wt 173.1 lb

## 2021-04-01 DIAGNOSIS — I213 ST elevation (STEMI) myocardial infarction of unspecified site: Secondary | ICD-10-CM | POA: Insufficient documentation

## 2021-04-01 DIAGNOSIS — Z955 Presence of coronary angioplasty implant and graft: Secondary | ICD-10-CM | POA: Diagnosis present

## 2021-04-01 NOTE — Patient Instructions (Signed)
Patient Instructions  Patient Details  Name: Morgan Mcdowell MRN: [id number] Date of Birth: 28-Aug-1980 Referring Provider:  Iran Ouch, MD  Below are your personal goals for exercise, nutrition, and risk factors. Our goal is to help you stay on track towards obtaining and maintaining these goals. We will be discussing your progress on these goals with you throughout the program.  Initial Exercise Prescription:  Initial Exercise Prescription - 04/01/21 1500       Date of Initial Exercise RX and Referring Provider   Date 04/01/21    Referring Provider Arida      Treadmill   MPH 2.6    Grade 2    Minutes 15    METs 3.7      Recumbant Bike   Level 3    RPM 60    Watts 45    Minutes 15    METs 3.7      NuStep   Level 3    SPM 80    Minutes 15    METs 3.7      Elliptical   Level 1    Speed 2.5    Minutes 15    METs 3.7      REL-XR   Level 3    Speed 50    Minutes 15    METs 3.7      T5 Nustep   Level 2    SPM 80    Minutes 15    METs 3.7      Prescription Details   Frequency (times per week) 3    Duration Progress to 30 minutes of continuous aerobic without signs/symptoms of physical distress      Intensity   THRR 40-80% of Max Heartrate 121-160    Ratings of Perceived Exertion 11-15    Perceived Dyspnea 0-4      Resistance Training   Training Prescription Yes    Weight 3 lb    Reps 10-15             Exercise Goals: Frequency: Be able to perform aerobic exercise two to three times per week in program working toward 2-5 days per week of home exercise.  Intensity: Work with a perceived exertion of 11 (fairly light) - 15 (hard) while following your exercise prescription.  We will make changes to your prescription with you as you progress through the program.   Duration: Be able to do 30 to 45 minutes of continuous aerobic exercise in addition to a 5 minute warm-up and a 5 minute cool-down routine.   Nutrition Goals: Your personal  nutrition goals will be established when you do your nutrition analysis with the dietician.  The following are general nutrition guidelines to follow: Cholesterol < 200mg /day Sodium < 1500mg /day Fiber: Women under 50 yrs - 25 grams per day  Personal Goals:  Personal Goals and Risk Factors at Admission - 04/01/21 1517       Core Components/Risk Factors/Patient Goals on Admission   Tobacco Cessation Yes    Intervention Assist the participant in steps to quit. Provide individualized education and counseling about committing to Tobacco Cessation, relapse prevention, and pharmacological support that can be provided by physician.; , assist with locating and accessing local/national Quit Smoking programs, and support quit date choice.   gave 14/08/22 packet   Expected Outcomes Short Term: Will demonstrate readiness to quit, by selecting a quit date.;Short Term: Will quit all tobacco product use, adhering to prevention of relapse plan.;Long Term:  Complete abstinence from all tobacco products for at least 12 months from quit date.    Lipids Yes    Intervention Provide education and support for participant on nutrition & aerobic/resistive exercise along with prescribed medications to achieve LDL 70mg , HDL >40mg .    Expected Outcomes Short Term: Participant states understanding of desired cholesterol values and is compliant with medications prescribed. Participant is following exercise prescription and nutrition guidelines.;Long Term: Cholesterol controlled with medications as prescribed, with individualized exercise RX and with personalized nutrition plan. Value goals: LDL < 70mg , HDL > 40 mg.             Tobacco Use Initial Evaluation: Social History   Tobacco Use  Smoking Status Former   Packs/day: 1.00   Years: 15.00   Pack years: 15.00   Types: Cigarettes   Quit date: 03/04/2021   Years since quitting: 0.0  Smokeless Tobacco Never    Exercise Goals  and Review:  Exercise Goals     Row Name 04/01/21 1516             Exercise Goals   Increase Physical Activity Yes       Intervention Provide advice, education, support and counseling about physical activity/exercise needs.;Develop an individualized exercise prescription for aerobic and resistive training based on initial evaluation findings, risk stratification, comorbidities and participant's personal goals.       Expected Outcomes Short Term: Attend rehab on a regular basis to increase amount of physical activity.;Long Term: Add in home exercise to make exercise part of routine and to increase amount of physical activity.;Long Term: Exercising regularly at least 3-5 days a week.       Increase Strength and Stamina Yes       Intervention Provide advice, education, support and counseling about physical activity/exercise needs.;Develop an individualized exercise prescription for aerobic and resistive training based on initial evaluation findings, risk stratification, comorbidities and participant's personal goals.       Expected Outcomes Short Term: Increase workloads from initial exercise prescription for resistance, speed, and METs.;Short Term: Perform resistance training exercises routinely during rehab and add in resistance training at home;Long Term: Improve cardiorespiratory fitness, muscular endurance and strength as measured by increased METs and functional capacity (14/08/22)       Able to understand and use rate of perceived exertion (RPE) scale Yes       Intervention Provide education and explanation on how to use RPE scale       Expected Outcomes Short Term: Able to use RPE daily in rehab to express subjective intensity level;Long Term:  Able to use RPE to guide intensity level when exercising independently       Able to understand and use Dyspnea scale Yes       Intervention Provide education and explanation on how to use Dyspnea scale       Expected Outcomes Short Term: Able to use  Dyspnea scale daily in rehab to express subjective sense of shortness of breath during exertion;Long Term: Able to use Dyspnea scale to guide intensity level when exercising independently       Knowledge and understanding of Target Heart Rate Range (THRR) Yes       Intervention Provide education and explanation of THRR including how the numbers were predicted and where they are located for reference       Expected Outcomes Short Term: Able to state/look up THRR;Short Term: Able to use daily as guideline for intensity in rehab;Long Term: Able to use THRR to  govern intensity when exercising independently       Able to check pulse independently Yes       Intervention Provide education and demonstration on how to check pulse in carotid and radial arteries.;Review the importance of being able to check your own pulse for safety during independent exercise       Expected Outcomes Short Term: Able to explain why pulse checking is important during independent exercise;Long Term: Able to check pulse independently and accurately       Understanding of Exercise Prescription Yes       Intervention Provide education, explanation, and written materials on patient's individual exercise prescription       Expected Outcomes Short Term: Able to explain program exercise prescription;Long Term: Able to explain home exercise prescription to exercise independently                Copy of goals given to participant.

## 2021-04-01 NOTE — Progress Notes (Signed)
Cardiac Individual Treatment Plan  Patient Details  Name: ROYA GIESELMAN MRN: [id number] Date of Birth: [birth year] Referring Provider:   Flowsheet Row Cardiac Rehab from 04/01/2021 in [facility] Cardiac and Pulmonary Rehab  Referring Provider Arida       Initial Encounter Date:  Flowsheet Row Cardiac Rehab from 04/01/2021 in [facility] Cardiac and Pulmonary Rehab  Date 04/01/21       Visit Diagnosis: ST elevation myocardial infarction (STEMI), unspecified artery [facility])  Status post coronary artery stent placement  Patient's Home Medications on Admission:  Current Outpatient Medications:    aspirin 81 MG chewable tablet, Chew 1 tablet (81 mg total) by mouth daily., Disp: 90 tablet, Rfl: 3   atorvastatin (LIPITOR) 80 MG tablet, Take 1 tablet (80 mg total) by mouth daily., Disp: 90 tablet, Rfl: 3   cyclobenzaprine (FLEXERIL) 10 MG tablet, Take 1 tablet (10 mg total) by mouth at bedtime. Do not drive while taking as can cause drowsiness (Patient not taking: Reported on 03/11/2021), Disp: 15 tablet, Rfl: 0   metoprolol succinate (TOPROL-XL) 25 MG 24 hr tablet, Take 0.5 tablets (12.5 mg total) by mouth daily. Take with or immediately following a meal., Disp: 45 tablet, Rfl: 3   nicotine (NICODERM CQ - DOSED IN MG/24 HOURS) 21 mg/24hr patch, Place 1 patch (21 mg total) onto the skin daily. (Patient not taking: Reported on 03/11/2021), Disp: 28 patch, Rfl: 0   nitroGLYCERIN (NITROSTAT) 0.4 MG SL tablet, Place 1 tablet (0.4 mg total) under the tongue every 5 (five) minutes as needed for chest pain., Disp: 25 tablet, Rfl: 2   prasugrel (EFFIENT) 10 MG TABS tablet, Take 1 tablet (10 mg total) by mouth daily., Disp: 90 tablet, Rfl: 3   rifampin (RIFADIN) 300 MG capsule, Take 2 capsules (600 mg total) by mouth daily., Disp: 60 capsule, Rfl: 3   TREMFYA 100 MG/ML SOPN, Inject into the skin. (Patient not taking: Reported on 03/11/2021), Disp: , Rfl:   Past Medical History: Past Medical History:  Diagnosis  Date   CAD (coronary artery disease)    a. 02/2021 Inf STEMI/PCI: LM nl, LCX nl, OM1/2/3 nl, RCA 30p, RPAV 100 (2.5x12 Onyx Frontier DES), RPL1 nl, RPL2 100. EF 50-55%.   Ectopic pregnancy    GERD (gastroesophageal reflux disease)    OCC-TUMS PRN   History of echocardiogram    a. 02/2021 Echo: EF 60-65%, no nrwma, nl RV size/fxn, triv MR.   Hyperlipidemia LDL goal <70    Latent tuberculosis by blood test    Psoriasis    Tobacco abuse     Tobacco Use: Social History   Tobacco Use  Smoking Status Former   Packs/day: 1.00   Years: 15.00   Pack years: 15.00   Types: Cigarettes   Quit date: 03/04/2021   Years since quitting: 0.0  Smokeless Tobacco Never    Labs: Recent Review Advice worker     Labs for ITP Cardiac and Pulmonary Rehab Latest Ref Rng & Units 03/06/2021   Cholestrol 0 - 200 mg/dL 782(N)   LDLCALC 0 - 99 mg/dL 562(Z)   HDL >30 mg/dL 86(V)   Trlycerides <784 mg/dL 84   Hemoglobin O9G 4.8 - 5.6 % 6.2(H)        Exercise Target Goals: Exercise Program Goal: Individual exercise prescription set using results from initial 6 min walk test and THRR while considering  patient's activity barriers and safety.   Exercise Prescription Goal: Initial exercise prescription builds to 30-45 minutes a day of aerobic activity,  2-3 days per week.  Home exercise guidelines will be given to patient during program as part of exercise prescription that the participant will acknowledge.   Education: Aerobic Exercise: - Group verbal and visual presentation on the components of exercise prescription. Introduces F.I.T.T principle from ACSM for exercise prescriptions.  Reviews F.I.T.T. principles of aerobic exercise including progression. Written material given at graduation.   Education: Resistance Exercise: - Group verbal and visual presentation on the components of exercise prescription. Introduces F.I.T.T principle from ACSM for exercise prescriptions  Reviews F.I.T.T.  principles of resistance exercise including progression. Written material given at graduation.    Education: Exercise & Equipment Safety: - Individual verbal instruction and demonstration of equipment use and safety with use of the equipment. Flowsheet Row Cardiac Rehab from 04/01/2021 in [facility] Cardiac and Pulmonary Rehab  Date 04/01/21  Educator AS  Instruction Review Code 5- Refused Teaching       Education: Exercise Physiology & General Exercise Guidelines: - Group verbal and written instruction with models to review the exercise physiology of the cardiovascular system and associated critical values. Provides general exercise guidelines with specific guidelines to those with heart or lung disease.    Education: Flexibility, Balance, Mind/Body Relaxation: - Group verbal and visual presentation with interactive activity on the components of exercise prescription. Introduces F.I.T.T principle from ACSM for exercise prescriptions. Reviews F.I.T.T. principles of flexibility and balance exercise training including progression. Also discusses the mind body connection.  Reviews various relaxation techniques to help reduce and manage stress (i.e. Deep breathing, progressive muscle relaxation, and visualization). Balance handout provided to take home. Written material given at graduation.   Activity Barriers & Risk Stratification:  Activity Barriers & Cardiac Risk Stratification - 03/17/21 1407       Activity Barriers & Cardiac Risk Stratification   Activity Barriers None    Cardiac Risk Stratification Moderate             6 Minute Walk:  6 Minute Walk     Row Name 04/01/21 1505         6 Minute Walk   Phase Initial     Distance 1375 feet     Walk Time 6 minutes     # of Rest Breaks 0     MPH 2.6     METS 4.53     RPE 7     Perceived Dyspnea  0     VO2 Peak 15.86     Symptoms No     Resting HR 82 bpm     Resting BP 90/56     Resting Oxygen Saturation  97 %     Exercise  Oxygen Saturation  during 6 min walk 98 %     Max Ex. HR 112 bpm     Max Ex. BP 114/60     2 Minute Post BP 90/56              Oxygen Initial Assessment:   Oxygen Re-Evaluation:   Oxygen Discharge (Final Oxygen Re-Evaluation):   Initial Exercise Prescription:  Initial Exercise Prescription - 04/01/21 1500       Date of Initial Exercise RX and Referring Provider   Date 04/01/21    Referring Provider Arida      Treadmill   MPH 2.6    Grade 2    Minutes 15    METs 3.7      Recumbant Bike   Level 3    RPM 60    Watts 45  Minutes 15    METs 3.7      NuStep   Level 3    SPM 80    Minutes 15    METs 3.7      Elliptical   Level 1    Speed 2.5    Minutes 15    METs 3.7      REL-XR   Level 3    Speed 50    Minutes 15    METs 3.7      T5 Nustep   Level 2    SPM 80    Minutes 15    METs 3.7      Prescription Details   Frequency (times per week) 3    Duration Progress to 30 minutes of continuous aerobic without signs/symptoms of physical distress      Intensity   THRR 40-80% of Max Heartrate 121-160    Ratings of Perceived Exertion 11-15    Perceived Dyspnea 0-4      Resistance Training   Training Prescription Yes    Weight 3 lb    Reps 10-15             Perform Capillary Blood Glucose checks as needed.  Exercise Prescription Changes:   Exercise Prescription Changes     Row Name 04/01/21 1500             Response to Exercise   Blood Pressure (Admit) 90/56       Blood Pressure (Exercise) 114/60       Blood Pressure (Exit) 90/56       Heart Rate (Admit) 82 bpm       Heart Rate (Exercise) 112 bpm       Heart Rate (Exit) 86 bpm       Oxygen Saturation (Admit) 97 %       Oxygen Saturation (Exercise) 98 %       Rating of Perceived Exertion (Exercise) 7       Perceived Dyspnea (Exercise) 0       Symptoms none                Exercise Comments:   Exercise Goals and Review:   Exercise Goals     Row Name 04/01/21  1516             Exercise Goals   Increase Physical Activity Yes       Intervention Provide advice, education, support and counseling about physical activity/exercise needs.;Develop an individualized exercise prescription for aerobic and resistive training based on initial evaluation findings, risk stratification, comorbidities and participant's personal goals.       Expected Outcomes Short Term: Attend rehab on a regular basis to increase amount of physical activity.;Long Term: Add in home exercise to make exercise part of routine and to increase amount of physical activity.;Long Term: Exercising regularly at least 3-5 days a week.       Increase Strength and Stamina Yes       Intervention Provide advice, education, support and counseling about physical activity/exercise needs.;Develop an individualized exercise prescription for aerobic and resistive training based on initial evaluation findings, risk stratification, comorbidities and participant's personal goals.       Expected Outcomes Short Term: Increase workloads from initial exercise prescription for resistance, speed, and METs.;Short Term: Perform resistance training exercises routinely during rehab and add in resistance training at home;Long Term: Improve cardiorespiratory fitness, muscular endurance and strength as measured by increased METs and functional capacity ( )  Able to understand and use rate of perceived exertion (RPE) scale Yes       Intervention Provide education and explanation on how to use RPE scale       Expected Outcomes Short Term: Able to use RPE daily in rehab to express subjective intensity level;Long Term:  Able to use RPE to guide intensity level when exercising independently       Able to understand and use Dyspnea scale Yes       Intervention Provide education and explanation on how to use Dyspnea scale       Expected Outcomes Short Term: Able to use Dyspnea scale daily in rehab to express subjective sense  of shortness of breath during exertion;Long Term: Able to use Dyspnea scale to guide intensity level when exercising independently       Knowledge and understanding of Target Heart Rate Range (THRR) Yes       Intervention Provide education and explanation of THRR including how the numbers were predicted and where they are located for reference       Expected Outcomes Short Term: Able to state/look up THRR;Short Term: Able to use daily as guideline for intensity in rehab;Long Term: Able to use THRR to govern intensity when exercising independently       Able to check pulse independently Yes       Intervention Provide education and demonstration on how to check pulse in carotid and radial arteries.;Review the importance of being able to check your own pulse for safety during independent exercise       Expected Outcomes Short Term: Able to explain why pulse checking is important during independent exercise;Long Term: Able to check pulse independently and accurately       Understanding of Exercise Prescription Yes       Intervention Provide education, explanation, and written materials on patient's individual exercise prescription       Expected Outcomes Short Term: Able to explain program exercise prescription;Long Term: Able to explain home exercise prescription to exercise independently                Exercise Goals Re-Evaluation :   Discharge Exercise Prescription (Final Exercise Prescription Changes):  Exercise Prescription Changes - 04/01/21 1500       Response to Exercise   Blood Pressure (Admit) 90/56    Blood Pressure (Exercise) 114/60    Blood Pressure (Exit) 90/56    Heart Rate (Admit) 82 bpm    Heart Rate (Exercise) 112 bpm    Heart Rate (Exit) 86 bpm    Oxygen Saturation (Admit) 97 %    Oxygen Saturation (Exercise) 98 %    Rating of Perceived Exertion (Exercise) 7    Perceived Dyspnea (Exercise) 0    Symptoms none             Nutrition:  Target Goals:  Understanding of nutrition guidelines, daily intake of sodium 1500mg , cholesterol 200mg , calories 30% from fat and 7% or less from saturated fats, daily to have 5 or more servings of fruits and vegetables.  Education: All About Nutrition: -Group instruction provided by verbal, written material, interactive activities, discussions, models, and posters to present general guidelines for heart healthy nutrition including fat, fiber, MyPlate, the role of sodium in heart healthy nutrition, utilization of the nutrition label, and utilization of this knowledge for meal planning. Follow up email sent as well. Written material given at graduation. Flowsheet Row Cardiac Rehab from 04/01/2021 in [facility] Cardiac and Pulmonary Rehab  Education need  identified 04/01/21       Biometrics:  Pre Biometrics - 04/01/21 1517       Pre Biometrics   Height 5\' 5"  (1.651 m)    Weight 173 lb 1.6 oz (78.5 kg)    BMI (Calculated) 28.81    Single Leg Stand 30 seconds              Nutrition Therapy Plan and Nutrition Goals:   Nutrition Assessments:  MEDIFICTS Score Key: ?70 Need to make dietary changes  40-70 Heart Healthy Diet ? 40 Therapeutic Level Cholesterol Diet  Flowsheet Row Cardiac Rehab from 04/01/2021 in [facility] Cardiac and Pulmonary Rehab  Picture Your Plate Total Score on Admission 44      Picture Your Plate Scores: [facility] Unhealthy dietary pattern with much room for improvement. 41-50 Dietary pattern unlikely to meet recommendations for good health and room for improvement. 51-60 More healthful dietary pattern, with some room for improvement.  >60 Healthy dietary pattern, although there may be some specific behaviors that could be improved.    Nutrition Goals Re-Evaluation:   Nutrition Goals Discharge (Final Nutrition Goals Re-Evaluation):   Psychosocial: Target Goals: Acknowledge presence or absence of significant depression and/or stress, maximize coping skills, provide positive  support system. Participant is able to verbalize types and ability to use techniques and skills needed for reducing stress and depression.   Education: Stress, Anxiety, and Depression - Group verbal and visual presentation to define topics covered.  Reviews how body is impacted by stress, anxiety, and depression.  Also discusses healthy ways to reduce stress and to treat/manage anxiety and depression.  Written material given at graduation.   Education: Sleep Hygiene -Provides group verbal and written instruction about how sleep can affect your health.  Define sleep hygiene, discuss sleep cycles and impact of sleep habits. Review good sleep hygiene tips.    Initial Review & Psychosocial Screening:  Initial Psych Review & Screening - 03/17/21 1412       Initial Review   Current issues with Current Stress Concerns      Family Dynamics   Good Support System? Yes   husband     Barriers   Psychosocial barriers to participate in program There are no identifiable barriers or psychosocial needs.;The patient should benefit from training in stress management and relaxation.      Screening Interventions   Interventions Encouraged to exercise;Provide feedback about the scores to participant;To provide support and resources with identified psychosocial needs    Expected Outcomes Short Term goal: Utilizing psychosocial counselor, staff and physician to assist with identification of specific Stressors or current issues interfering with healing process. Setting desired goal for each stressor or current issue identified.;Long Term Goal: Stressors or current issues are controlled or eliminated.;Short Term goal: Identification and review with participant of any Quality of Life or Depression concerns found by scoring the questionnaire.;Long Term goal: The participant improves quality of Life and PHQ9 Scores as seen by post scores and/or verbalization of changes             Quality of Life Scores:    Quality of Life - 04/01/21 1518       Quality of Life   Select Quality of Life      Quality of Life Scores   Health/Function Pre 24.07 %    Socioeconomic Pre 20.69 %    Psych/Spiritual Pre 25.64 %    Family Pre 30 %    GLOBAL Pre 24.46 %  Scores of 19 and below usually indicate a poorer quality of life in these areas.  A difference of  2-3 points is a clinically meaningful difference.  A difference of 2-3 points in the total score of the Quality of Life Index has been associated with significant improvement in overall quality of life, self-image, physical symptoms, and general health in studies assessing change in quality of life.  PHQ-9: Recent Review Flowsheet Data     Depression screen [facility] 2/9 04/01/2021   Decreased Interest 0   Down, Depressed, Hopeless 1   PHQ - 2 Score 1   Altered sleeping 0   Tired, decreased energy 1   Change in appetite 1   Feeling bad or failure about yourself  0   Trouble concentrating 0   Moving slowly or fidgety/restless 0   Suicidal thoughts 0   PHQ-9 Score 3   Difficult doing work/chores Not difficult at all      Interpretation of Total Score  Total Score Depression Severity:  1-4 = Minimal depression, 5-9 = Mild depression, 10-14 = Moderate depression, 15-19 = Moderately severe depression, 20-27 = Severe depression   Psychosocial Evaluation and Intervention:  Psychosocial Evaluation - 03/17/21 1426       Psychosocial Evaluation & Interventions   Interventions Encouraged to exercise with the program and follow exercise prescription    Comments Reniyah is coming to cardiac rehab post STEMI w/ stent. She reports feeling well and is planning on returning back to work Monday after Thanksgiving. She has a great support system at home. She stated her biggest stressor is trying to figure out all she needs to do and change after this heart attack, so she is ready to get started in the program. Her daughter keeps her busy with travel  ball and she typically works 6a-4p so eating heatlhy can be a struggle.    Expected Outcomes Short: attend cardiac rehab for education and exercise. Long: develop and maintain positive self care habits.    Continue Psychosocial Services  Follow up required by staff             Psychosocial Re-Evaluation:   Psychosocial Discharge (Final Psychosocial Re-Evaluation):   Vocational Rehabilitation: Provide vocational rehab assistance to qualifying candidates.   Vocational Rehab Evaluation & Intervention:  Vocational Rehab - 03/17/21 1412       Initial Vocational Rehab Evaluation & Intervention   Assessment shows need for Vocational Rehabilitation No             Education: Education Goals: Education classes will be provided on a variety of topics geared toward better understanding of heart health and risk factor modification. Participant will state understanding/return demonstration of topics presented as noted by education test scores.  Learning Barriers/Preferences:  Learning Barriers/Preferences - 03/17/21 1411       Learning Barriers/Preferences   Learning Barriers None    Learning Preferences None             General Cardiac Education Topics:  AED/CPR: - Group verbal and written instruction with the use of models to demonstrate the basic use of the AED with the basic ABC's of resuscitation.   Anatomy and Cardiac Procedures: - Group verbal and visual presentation and models provide information about basic cardiac anatomy and function. Reviews the testing methods done to diagnose heart disease and the outcomes of the test results. Describes the treatment choices: Medical Management, Angioplasty, or Coronary Bypass Surgery for treating various heart conditions including Myocardial Infarction, Angina, Valve Disease, and  Cardiac Arrhythmias.  Written material given at graduation.   Medication Safety: - Group verbal and visual instruction to review commonly  prescribed medications for heart and lung disease. Reviews the medication, class of the drug, and side effects. Includes the steps to properly store meds and maintain the prescription regimen.  Written material given at graduation.   Intimacy: - Group verbal instruction through game format to discuss how heart and lung disease can affect sexual intimacy. Written material given at graduation..   Know Your Numbers and Heart Failure: - Group verbal and visual instruction to discuss disease risk factors for cardiac and pulmonary disease and treatment options.  Reviews associated critical values for Overweight/Obesity, Hypertension, Cholesterol, and Diabetes.  Discusses basics of heart failure: signs/symptoms and treatments.  Introduces Heart Failure Zone chart for action plan for heart failure.  Written material given at graduation.   Infection Prevention: - Provides verbal and written material to individual with discussion of infection control including proper hand washing and proper equipment cleaning during exercise session. Flowsheet Row Cardiac Rehab from 04/01/2021 in [facility] Cardiac and Pulmonary Rehab  Date 04/01/21  Educator AS  Instruction Review Code 1- Verbalizes Understanding       Falls Prevention: - Provides verbal and written material to individual with discussion of falls prevention and safety. Flowsheet Row Cardiac Rehab from 04/01/2021 in [facility] Cardiac and Pulmonary Rehab  Date 04/01/21  Educator AS  Instruction Review Code 1- Verbalizes Understanding       Other: -Provides group and verbal instruction on various topics (see comments)   Knowledge Questionnaire Score:  Knowledge Questionnaire Score - 04/01/21 1519       Knowledge Questionnaire Score   Pre Score 25/26             Core Components/Risk Factors/Patient Goals at Admission:  Personal Goals and Risk Factors at Admission - 04/01/21 1517       Core Components/Risk Factors/Patient Goals on Admission    Tobacco Cessation Yes    Intervention Assist the participant in steps to quit. Provide individualized education and counseling about committing to Tobacco Cessation, relapse prevention, and pharmacological support that can be provided by physician.;Education officer, environmental, assist with locating and accessing local/national Quit Smoking programs, and support quit date choice.   gave [employer] packet   Expected Outcomes Short Term: Will demonstrate readiness to quit, by selecting a quit date.;Short Term: Will quit all tobacco product use, adhering to prevention of relapse plan.;Long Term: Complete abstinence from all tobacco products for at least 12 months from quit date.    Lipids Yes    Intervention Provide education and support for participant on nutrition & aerobic/resistive exercise along with prescribed medications to achieve LDL 70mg , HDL >40mg .    Expected Outcomes Short Term: Participant states understanding of desired cholesterol values and is compliant with medications prescribed. Participant is following exercise prescription and nutrition guidelines.;Long Term: Cholesterol controlled with medications as prescribed, with individualized exercise RX and with personalized nutrition plan. Value goals: LDL < , HDL > 40 mg.             Education:Diabetes - Individual verbal and written instruction to review signs/symptoms of diabetes, desired ranges of glucose level fasting, after meals and with exercise. Acknowledge that pre and post exercise glucose checks will be done for 3 sessions at entry of program.   Core Components/Risk Factors/Patient Goals Review:    Core Components/Risk Factors/Patient Goals at Discharge (Final Review):    ITP Comments:  ITP Comments  Row Name 03/17/21 1424           ITP Comments Initial telephone orientation completed. Diagnosis can be found in [facility] 11/12. EP orientation scheduled for Thursday 12/8 at 2pm.                 Comments: initial ITP

## 2021-04-05 ENCOUNTER — Other Ambulatory Visit: Payer: Self-pay

## 2021-04-05 ENCOUNTER — Encounter: Payer: BC Managed Care – PPO | Admitting: *Deleted

## 2021-04-05 DIAGNOSIS — Z955 Presence of coronary angioplasty implant and graft: Secondary | ICD-10-CM

## 2021-04-05 DIAGNOSIS — I213 ST elevation (STEMI) myocardial infarction of unspecified site: Secondary | ICD-10-CM

## 2021-04-05 NOTE — Progress Notes (Signed)
Daily Session Note  Patient Details  Name: Morgan Mcdowell MRN: [id number] Date of Birth: 03-01-1981 Referring Provider:   Flowsheet Row Cardiac Rehab from 04/01/2021 in [facility] Cardiac and Pulmonary Rehab  Referring Provider Fletcher Anon       Encounter Date: 04/05/2021  Check In:  Session Check In - 04/05/21 1711       Check-In   Supervising physician immediately available to respond to emergencies See telemetry face sheet for immediately available ER MD    Location [facility]-Cardiac & Pulmonary Rehab    Staff Present Renita Papa, RN BSN;Joseph [place], RCP,RRT,BSRT;Kara [place], [place], ASCM CEP, Exercise Physiologist    Virtual Visit No    Medication changes reported     No    Fall or balance concerns reported    No    Warm-up and Cool-down Performed on first and last piece of equipment    Resistance Training Performed Yes    VAD Patient? No    PAD/SET Patient? No      Pain Assessment   Currently in Pain? No/denies                Social History   Tobacco Use  Smoking Status Former   Packs/day: 1.00   Years: 15.00   Pack years: 15.00   Types: Cigarettes   Quit date: 03/04/2021   Years since quitting: 0.0  Smokeless Tobacco Never    Goals Met:  Independence with exercise equipment Exercise tolerated well No report of concerns or symptoms today Strength training completed today  Goals Unmet:  Not Applicable  Comments: First full day of exercise!  Patient was oriented to gym and equipment including functions, settings, policies, and procedures.  Patient's individual exercise prescription and treatment plan were reviewed.  All starting workloads were established based on the results of the 6 minute walk test done at initial orientation visit.  The plan for exercise progression was also introduced and progression will be customized based on patient's performance and goals.     Dr. Emily Filbert is Medical Director for [place].  Dr. Ottie Glazier is Medical Director for [facility] Pulmonary Rehabilitation.

## 2021-04-07 ENCOUNTER — Other Ambulatory Visit: Payer: Self-pay

## 2021-04-07 DIAGNOSIS — I213 ST elevation (STEMI) myocardial infarction of unspecified site: Secondary | ICD-10-CM

## 2021-04-07 DIAGNOSIS — Z955 Presence of coronary angioplasty implant and graft: Secondary | ICD-10-CM

## 2021-04-07 NOTE — Progress Notes (Signed)
Daily Session Note  Patient Details  Name: Morgan Mcdowell MRN: [id number] Date of Birth: [birth year] Referring Provider:   Flowsheet Row Cardiac Rehab from 04/01/2021 in [facility] Cardiac and Pulmonary Rehab  Referring Provider Fletcher Anon       Encounter Date: 04/07/2021  Check In:  Session Check In - 04/07/21 1650       Check-In   Supervising physician immediately available to respond to emergencies See telemetry face sheet for immediately available ER MD    Location [facility]-Cardiac & Pulmonary Rehab    Staff Present Birdie Sons, MPA, Nino Glow, MS, ASCM CEP, Exercise Physiologist;Meredith Sherryll Burger, RN BSN    Virtual Visit No    Medication changes reported     No    Fall or balance concerns reported    No    Warm-up and Cool-down Performed on first and last piece of equipment    Resistance Training Performed Yes    VAD Patient? No    PAD/SET Patient? No      Pain Assessment   Currently in Pain? No/denies                Social History   Tobacco Use  Smoking Status Former   Packs/day: 1.00   Years: 15.00   Pack years: 15.00   Types: Cigarettes   Quit date: 03/04/2021   Years since quitting: 0.0  Smokeless Tobacco Never    Goals Met:  Independence with exercise equipment Exercise tolerated well No report of concerns or symptoms today Strength training completed today  Goals Unmet:  Not Applicable  Comments: Pt able to follow exercise prescription today without complaint.  Will continue to monitor for progression.    Dr. Emily Filbert is Medical Director for [place].  Dr. Ottie Glazier is Medical Director for [facility] Pulmonary Rehabilitation.

## 2021-04-08 ENCOUNTER — Encounter: Payer: BC Managed Care – PPO | Admitting: *Deleted

## 2021-04-08 DIAGNOSIS — I213 ST elevation (STEMI) myocardial infarction of unspecified site: Secondary | ICD-10-CM

## 2021-04-08 DIAGNOSIS — Z955 Presence of coronary angioplasty implant and graft: Secondary | ICD-10-CM

## 2021-04-08 NOTE — Progress Notes (Signed)
Daily Session Note  Patient Details  Name: Morgan Mcdowell MRN: [id number] Date of Birth: Feb 09, 1981 Referring Provider:   Flowsheet Row Cardiac Rehab from 04/01/2021 in [facility] Cardiac and Pulmonary Rehab  Referring Provider Fletcher Anon       Encounter Date: 04/08/2021  Check In:  Session Check In - 04/08/21 1714       Check-In   Supervising physician immediately available to respond to emergencies See telemetry face sheet for immediately available ER MD    Location [facility]Cardiac & Pulmonary Rehab    Staff Present Renita Papa, RN Vickki Hearing, BA, ACSM CEP, Exercise Physiologist;Kara Eliezer Bottom, MS, ASCM CEP, Exercise Physiologist    Virtual Visit No    Medication changes reported     No    Fall or balance concerns reported    No    Warm-up and Cool-down Performed on first and last piece of equipment    Resistance Training Performed Yes    VAD Patient? No    PAD/SET Patient? No      Pain Assessment   Currently in Pain? No/denies                Social History   Tobacco Use  Smoking Status Former   Packs/day: 1.00   Years: 15.00   Pack years: 15.00   Types: Cigarettes   Quit date: 03/04/2021   Years since quitting: 0.0  Smokeless Tobacco Never    Goals Met:  Independence with exercise equipment Exercise tolerated well No report of concerns or symptoms today Strength training completed today  Goals Unmet:  Not Applicable  Comments: Pt able to follow exercise prescription today without complaint.  Will continue to monitor for progression.    Dr. Emily Filbert is Medical Director for [place].  Dr. Ottie Glazier is Medical Director for [place] Pul[facility].

## 2021-04-09 ENCOUNTER — Other Ambulatory Visit: Payer: Self-pay

## 2021-04-09 ENCOUNTER — Ambulatory Visit (INDEPENDENT_AMBULATORY_CARE_PROVIDER_SITE_OTHER): Payer: BC Managed Care – PPO | Admitting: Nurse Practitioner

## 2021-04-09 ENCOUNTER — Encounter: Payer: Self-pay | Admitting: Nurse Practitioner

## 2021-04-09 VITALS — BP 100/70 | HR 88 | Ht 65.0 in | Wt 170.0 lb

## 2021-04-09 DIAGNOSIS — E785 Hyperlipidemia, unspecified: Secondary | ICD-10-CM | POA: Diagnosis not present

## 2021-04-09 DIAGNOSIS — I2510 Atherosclerotic heart disease of native coronary artery without angina pectoris: Secondary | ICD-10-CM | POA: Diagnosis not present

## 2021-04-09 DIAGNOSIS — Z720 Tobacco use: Secondary | ICD-10-CM | POA: Diagnosis not present

## 2021-04-09 DIAGNOSIS — I2583 Coronary atherosclerosis due to lipid rich plaque: Secondary | ICD-10-CM | POA: Diagnosis not present

## 2021-04-09 NOTE — Progress Notes (Signed)
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  Past Medical History    Past Medical History:  Diagnosis Date   CAD (coronary artery disease)    a. 02/2021 Inf STEMI/PCI: LM nl, LCX nl, OM1/2/3 nl, RCA 30p, RPAV 100 (2.5x12 Onyx Frontier DES), RPL1 nl, RPL2 100. EF 50-55%.   Ectopic pregnancy    GERD (gastroesophageal reflux disease)    OCC-TUMS PRN   History of echocardiogram    a. 02/2021 Echo: EF 60-65%, no nrwma, nl RV size/fxn, triv MR.   Hyperlipidemia LDL goal <70    Latent tuberculosis by blood test    Psoriasis    Tobacco abuse    Past Surgical History:  Procedure Laterality Date   CHOLECYSTECTOMY     CORONARY/GRAFT ACUTE MI REVASCULARIZATION N/A 03/06/2021   Procedure: Coronary/Graft Acute MI Revascularization;  Surgeon: Iran Ouch, MD;  Location: [facility] INVASIVE CV LAB;  Service: Cardiovascular;  Laterality: N/A;   DG GALL BLADDER     DIAGNOSTIC LAPAROSCOPY WITH REMOVAL OF ECTOPIC PREGNANCY N/A 12/11/2015   Procedure: DIAGNOSTIC LAPAROSCOPY WITH REMOVAL OF ECTOPIC PREGNANCY;  Surgeon: Conard Novak, MD;  Location: [facility] ORS;  Service: Gynecology;  Laterality: N/A;   DILATION AND CURETTAGE OF UTERUS     LAPAROSCOPIC BILATERAL SALPINGECTOMY Bilateral 12/11/2015   Procedure: LAPAROSCOPIC BILATERAL SALPINGECTOMY;  Surgeon: Conard Novak, MD;  Location: [facility] ORS;  Service: Gynecology;  Laterality: Bilateral;   LEFT HEART CATH AND CORONARY ANGIOGRAPHY N/A 03/06/2021   Procedure: LEFT HEART CATH AND CORONARY ANGIOGRAPHY;  Surgeon: Iran Ouch, MD;  Location: [facility] INVASIVE CV LAB;  Service: Cardiovascular;   Laterality: N/A;    Allergies  No Known Allergies  History of Present Illness    40 year old female with the above past medical history including tobacco abuse, psoriasis, latent tuberculosis, and GERD.  On November 12, she developed chest pain, nausea, and vomiting, and was seen in urgent care where ECG showed inferior ST segment elevation.  She was transported to [facility] and underwent emergent diagnostic catheterization revealing an occlusion of the RPA V and otherwise nonobstructive disease.  The RPAV was successfully treated with a drug-eluting stent.  She had residual occlusion of the RPL 2, which was not felt to be amenable to PCI, due to size.  Troponin peaked at greater than 24,000.  Echocardiogram showed an EF of 60 to 65% without regional wall motion abnormalities, and trivial mitral regurgitation.  Morgan Mcdowell was last seen in clinic on November 17 at which time she was feeling well with the exception of orthostatic lightheadedness.  Metoprolol was reduced to 12.5 mg daily and she was otherwise continued on aspirin, statin, and prasugrel therapy.  She has since been participating in cardiac rehabilitation, and has been tolerating this well.  She has not had any chest pain, dyspnea, palpitations, PND, orthopnea, dizziness, syncope, edema, or early satiety.  She has had occasional positional lower extremity paresthesias, and was seen by primary care who diagnosed her with sciatica.  She also sometimes notes discomfort in her abdominal muscles if she is doing a lot of upper body work/twisting and bending at work.  No chest  pain however.  She continues to smoke 4 to 5 cigarettes/day and was recently prescribed Chantix, which she has not started.  Home Medications    Current Outpatient Medications  Medication Sig Dispense Refill   aspirin 81 MG chewable tablet Chew 1 tablet (81 mg total) by mouth daily. 90 tablet 3   atorvastatin (LIPITOR) 80 MG tablet Take 1 tablet (80 mg total) by mouth  daily. 90 tablet 3   HYDROcodone-acetaminophen (NORCO/VICODIN) 5-325 MG tablet Take 1 tablet by mouth 4 (four) times daily as needed.     metoprolol succinate (TOPROL-XL) 25 MG 24 hr tablet Take 0.5 tablets (12.5 mg total) by mouth daily. Take with or immediately following a meal. 45 tablet 3   nitroGLYCERIN (NITROSTAT) 0.4 MG SL tablet Place 1 tablet (0.4 mg total) under the tongue every 5 (five) minutes as needed for chest pain. 25 tablet 2   prasugrel (EFFIENT) 10 MG TABS tablet Take 1 tablet (10 mg total) by mouth daily. 90 tablet 3   No current facility-administered medications for this visit.     Review of Systems    Has had some sciatica discomfort.  She denies chest pain, dyspnea, palpitations, PND, orthopnea, dizziness, syncope, edema or early satiety.  All other systems reviewed and are otherwise negative except as noted above.    Physical Exam    VS:  BP 100/70 (BP Location: Left Arm, Patient Position: Sitting, Cuff Size: Large)    Pulse 88    Ht 5\' 5"  (1.651 m)    Wt 170 lb (77.1 kg)    SpO2 99%    BMI 28.29 kg/m  , BMI Body mass index is 28.29 kg/m.     GEN: Well nourished, well developed, in no acute distress. HEENT: normal. Neck: Supple, no JVD, carotid bruits, or masses. Cardiac: RRR, no murmurs, rubs, or gallops. No clubbing, cyanosis, edema.  Radials/PT 2+ and equal bilaterally.  Respiratory:  Respirations regular and unlabored, clear to auscultation bilaterally. GI: Soft, nontender, nondistended, BS + x 4. MS: no deformity or atrophy. Skin: warm and dry, no rash. Neuro:  Strength and sensation are intact. Psych: Normal affect.  Accessory Clinical Findings     Lab Results  Component Value Date   WBC 14.5 (H) 03/07/2021   HGB 11.6 (L) 03/07/2021   HCT 34.5 (L) 03/07/2021   MCV 91.8 03/07/2021   PLT 293 03/07/2021   Lab Results  Component Value Date   CREATININE 0.50 03/08/2021   BUN 12 03/08/2021   NA 137 03/08/2021   K 4.1 03/08/2021   CL 108  03/08/2021   CO2 25 03/08/2021   Lab Results  Component Value Date   ALT 43 03/07/2021   AST 119 (H) 03/07/2021   ALKPHOS 82 03/07/2021   BILITOT 0.6 03/07/2021   Lab Results  Component Value Date   CHOL 211 (H) 03/06/2021   HDL 22 (L) 03/06/2021   LDLCALC 172 (H) 03/06/2021   TRIG 84 03/06/2021   CHOLHDL 9.6 03/06/2021    Lab Results  Component Value Date   HGBA1C 6.2 (H) 03/06/2021    Assessment & Plan    1.  Coronary artery disease: Status post inferior STEMI in November with finding of an occluded RPAV, which was successfully treated with a drug-eluting stent.  She has residual occlusive RPL 2 disease, which is being medically managed given small size.  She has been doing well since her last visit and participating in cardiac rehabilitation without symptoms or limitations.  She remains  on aspirin, statin, beta-blocker, and prasugrel therapy.  No further lightheadedness since reducing her metoprolol dose at her last visit.  2.  Hyperlipidemia: LDL of 172 during hospitalization.  She is fasting this morning and I will follow-up lipids and LFTs.  With recent ACS, LDL goal is less than 55.  3.  Tobacco abuse: Still smoking 4 to 5 cigarettes/day.  Recently prescribed Chantix and is going to start this.  Complete cessation advised.  4.  Disposition: Follow-up lipids and LFTs today.  Follow-up in clinic in 3 months or sooner if necessary.   Nicolasa Ducking, NP 04/09/2021, 6:05 PM

## 2021-04-09 NOTE — Patient Instructions (Signed)
Medication Instructions:  Your physician recommends that you continue on your current medications as directed. Please refer to the Current Medication list given to you today.   *If you need a refill on your cardiac medications before your next appointment, please call your pharmacy*   Lab Work: LIPID AND  HEPATIC FUNCTION PANEL TO BE DRAWN TODAY IN OUR OFFICE  If you have labs (blood work) drawn today and your tests are completely normal, you will receive your results only by: MyChart Message (if you have MyChart) OR A paper copy in the mail If you have any lab test that is abnormal or we need to change your treatment, we will call you to review the results.   Testing/Procedures: None ordered  Follow-Up: At [facility], you and your health needs are our priority.  As part of our continuing mission to provide you with exceptional heart care, we have created designated Provider Care Teams.  These Care Teams include your primary Cardiologist (physician) and Advanced Practice Providers (APPs -  Physician Assistants and Nurse Practitioners) who all work together to provide you with the care you need, when you need it.  We recommend signing up for the patient portal called "MyChart".  Sign up information is provided on this After Visit Summary.  MyChart is used to connect with patients for Virtual Visits (Telemedicine).  Patients are able to view lab/test results, encounter notes, upcoming appointments, etc.  Non-urgent messages can be sent to your provider as well.   To learn more about what you can do with MyChart, go to [email or web address].    Your next appointment:   3 month(s)  The format for your next appointment:   In Person  Provider:   You may see Lorine Bears, MD or one of the following Advanced Practice Providers on your designated Care Team:   Nicolasa Ducking, NP Eula Listen, PA-C Cadence Fransico Michael, [place]

## 2021-04-10 LAB — LIPID PANEL
Chol/HDL Ratio: 5 ratio — ABNORMAL HIGH (ref 0.0–4.4)
Cholesterol, Total: 154 mg/dL (ref 100–199)
HDL: 31 mg/dL — ABNORMAL LOW (ref >39)
LDL Chol Calc (NIH): 93 mg/dL (ref 0–99)
Triglycerides: 174 mg/dL — ABNORMAL HIGH (ref 0–149)
VLDL Cholesterol Cal: 30 mg/dL (ref 5–40)

## 2021-04-10 LAB — HEPATIC FUNCTION PANEL
ALT: 23 IU/L (ref 0–32)
AST: 24 IU/L (ref 0–40)
Albumin: 4.1 g/dL (ref 3.8–4.8)
Alkaline Phosphatase: 97 IU/L (ref 44–121)
Bilirubin Total: 0.3 mg/dL (ref 0.0–1.2)
Bilirubin, Direct: <0.1 mg/dL (ref 0.00–0.40)
Total Protein: 7.3 g/dL (ref 6.0–8.5)

## 2021-04-12 ENCOUNTER — Encounter: Payer: BC Managed Care – PPO | Admitting: *Deleted

## 2021-04-12 ENCOUNTER — Other Ambulatory Visit: Payer: Self-pay

## 2021-04-12 ENCOUNTER — Telehealth: Payer: Self-pay | Admitting: *Deleted

## 2021-04-12 DIAGNOSIS — E785 Hyperlipidemia, unspecified: Secondary | ICD-10-CM

## 2021-04-12 DIAGNOSIS — I2510 Atherosclerotic heart disease of native coronary artery without angina pectoris: Secondary | ICD-10-CM

## 2021-04-12 DIAGNOSIS — Z955 Presence of coronary angioplasty implant and graft: Secondary | ICD-10-CM

## 2021-04-12 DIAGNOSIS — I213 ST elevation (STEMI) myocardial infarction of unspecified site: Secondary | ICD-10-CM

## 2021-04-12 DIAGNOSIS — I2119 ST elevation (STEMI) myocardial infarction involving other coronary artery of inferior wall: Secondary | ICD-10-CM

## 2021-04-12 MED ORDER — EZETIMIBE 10 MG PO TABS: 10.0000 mg | ORAL_TABLET | Freq: Every day | ORAL | 3 refills | Status: DC

## 2021-04-12 NOTE — Telephone Encounter (Signed)
Reviewed results and recommendations with patient. We then discussed ways to improve her numbers with lifestyle modifications and additional medication. Advised that we also want repeat fasting labs in roughly 8 weeks. Reviewed that I would have scheduling reach out to her to set that appointment up closer to that time. She verbalized understanding of our conversation, agreement with plan, and had no further questions at this time.

## 2021-04-12 NOTE — Progress Notes (Signed)
Daily Session Note  Patient Details  Name: Morgan Mcdowell MRN: [id number] Date of Birth: 1981-03-02 Referring Provider:   Flowsheet Row Cardiac Rehab from 04/01/2021 in [facility] Cardiac and Pulmonary Rehab  Referring Provider Fletcher Anon       Encounter Date: 04/12/2021  Check In:  Session Check In - 04/12/21 1712       Check-In   Supervising physician immediately available to respond to emergencies See telemetry face sheet for immediately available ER MD    Location [facility]-Cardiac & Pulmonary Rehab    Staff Present Renita Papa, RN BSN;Joseph [place], RCP,RRT,BSRT;Kara [place], [place], ASCM CEP, Exercise Physiologist    Virtual Visit No    Medication changes reported     No    Fall or balance concerns reported    No    Warm-up and Cool-down Performed on first and last piece of equipment    Resistance Training Performed Yes    VAD Patient? No    PAD/SET Patient? No      Pain Assessment   Currently in Pain? No/denies                Social History   Tobacco Use  Smoking Status Every Day   Packs/day: 1.00   Years: 15.00   Pack years: 15.00   Types: Cigarettes   Last attempt to quit: 03/04/2021   Years since quitting: 0.1  Smokeless Tobacco Never  Tobacco Comments   04/09/21 5 cigarettes per day    Goals Met:  Independence with exercise equipment Exercise tolerated well No report of concerns or symptoms today Strength training completed today  Goals Unmet:  Not Applicable  Comments: Pt able to follow exercise prescription today without complaint.  Will continue to monitor for progression.    Dr. Emily Filbert is Medical Director for [place].  Dr. Ottie Glazier is Medical Director for [facility] Pulmonary Rehabilitation.

## 2021-04-12 NOTE — Telephone Encounter (Signed)
-----   Message from Creig Hines, NP sent at 04/12/2021  2:52 PM EST ----- LDL cholesterol much improved - down to 93 from 172.  That said, in the setting of recent cardiac event, we'd really like to see her LDL < 55.  Continuing with lifestyle modifications cardiac rehab will help, but I still recommend that we start zetia 10mg  daily (in addition to the atorvastatin that she's taking).  Follow-up lipids/lfts in ~ 8 wks.

## 2021-04-14 ENCOUNTER — Encounter: Payer: BC Managed Care – PPO | Admitting: *Deleted

## 2021-04-14 ENCOUNTER — Other Ambulatory Visit: Payer: Self-pay

## 2021-04-14 DIAGNOSIS — Z955 Presence of coronary angioplasty implant and graft: Secondary | ICD-10-CM

## 2021-04-14 DIAGNOSIS — I213 ST elevation (STEMI) myocardial infarction of unspecified site: Secondary | ICD-10-CM

## 2021-04-14 NOTE — Progress Notes (Signed)
Daily Session Note  Patient Details  Name: Morgan Mcdowell MRN: [id number] Date of Birth: Jan 16, 1981 Referring Provider:   Flowsheet Row Cardiac Rehab from 04/01/2021 in [facility] Cardiac and Pulmonary Rehab  Referring Provider Fletcher Anon       Encounter Date: 04/14/2021  Check In:  Session Check In - 04/14/21 1642       Check-In   Supervising physician immediately available to respond to emergencies See telemetry face sheet for immediately available ER MD    Location [facility]-Cardiac & Pulmonary Rehab    Staff Present Renita Papa, RN BSN;Joseph Tessie [place]ass, RCP,RRT,BSRT;Kara [place], [place], ASCM CEP, Exercise Physiologist    Virtual Visit No    Medication changes reported     No    Fall or balance concerns reported    No    Warm-up and Cool-down Performed on first and last piece of equipment    Resistance Training Performed Yes    VAD Patient? No    PAD/SET Patient? No      Pain Assessment   Currently in Pain? No/denies                Social History   Tobacco Use  Smoking Status Every Day   Packs/day: 1.00   Years: 15.00   Pack years: 15.00   Types: Cigarettes   Last attempt to quit: 03/04/2021   Years since quitting: 0.1  Smokeless Tobacco Never  Tobacco Comments   04/09/21 5 cigarettes per day    Goals Met:  Independence with exercise equipment Exercise tolerated well No report of concerns or symptoms today Strength training completed today  Goals Unmet:  Not Applicable  Comments: Pt able to follow exercise prescription today without complaint.  Will continue to monitor for progression.    Dr. Emily Filbert is Medical Director for [place].  Dr. Ottie Glazier is Medical Director for [facility] Pulmonary Rehabilitation.

## 2021-04-14 NOTE — Progress Notes (Signed)
Completed initial RD consultation ?

## 2021-04-17 ENCOUNTER — Ambulatory Visit
Admission: EM | Admit: 2021-04-17 | Discharge: 2021-04-17 | Disposition: A | Discharge status: Home / Self Care | Payer: BC Managed Care – PPO

## 2021-04-17 ENCOUNTER — Other Ambulatory Visit: Payer: Self-pay

## 2021-04-17 ENCOUNTER — Encounter: Payer: Self-pay | Admitting: Emergency Medicine

## 2021-04-17 DIAGNOSIS — K047 Periapical abscess without sinus: Secondary | ICD-10-CM | POA: Diagnosis not present

## 2021-04-17 DIAGNOSIS — K0889 Other specified disorders of teeth and supporting structures: Secondary | ICD-10-CM

## 2021-04-17 MED ORDER — HYDROCODONE-ACETAMINOPHEN 5-325 MG PO TABS: 1.0000 | ORAL_TABLET | Freq: Four times a day (QID) | ORAL | 0 refills | Status: AC | PRN

## 2021-04-17 MED ORDER — AMOXICILLIN-POT CLAVULANATE 875-125 MG PO TABS: 1.0000 | ORAL_TABLET | Freq: Two times a day (BID) | ORAL | 0 refills | Status: AC

## 2021-04-17 NOTE — ED Provider Notes (Signed)
[facility]    CSN: [id number] Arrival date & time: 04/17/21  0806      History   Chief Complaint Chief Complaint  Patient presents with   Dental Pain    HPI Morgan Mcdowell is a 40 y.o. female presenting for 3-day history of dental pain of the right lower side.  Patient says she has a wisdom tooth that is trying to come through and is causing her pain.  She says that she thinks its not infected also.  Reports feeling like her lymph nodes are swollen on that side.  No facial swelling and no associated fevers.  Patient says she has been through a lot recently and has not been able to see the dentist.  Patient treated for tuberculosis a couple months ago and then and had a heart attack last month.  Has been taking Tylenol for the pain but says it has not helped.  HPI  Past Medical History:  Diagnosis Date   CAD (coronary artery disease)    a. 02/2021 Inf STEMI/PCI: LM nl, LCX nl, OM1/2/3 nl, RCA 30p, RPAV 100 (2.5x12 Onyx Frontier DES), RPL1 nl, RPL2 100. EF 50-55%.   Ectopic pregnancy    GERD (gastroesophageal reflux disease)    OCC-TUMS PRN   History of echocardiogram    a. 02/2021 Echo: EF 60-65%, no nrwma, nl RV size/fxn, triv MR.   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   Ectopic pregnancy, tubal 12/11/2015   Encounter for sterilization 12/11/2015    Past Surgical History:  Procedure Laterality Date   CHOLECYSTECTOMY     CORONARY/GRAFT ACUTE MI REVASCULARIZATION N/A 03/06/2021   Procedure: Coronary/Graft Acute MI Revascularization;  Surgeon: Iran Ouch, MD;  Location: [facility]  INVASIVE CV LAB;  Service: Cardiovascular;  Laterality: N/A;   DG GALL BLADDER     DIAGNOSTIC LAPAROSCOPY WITH REMOVAL OF ECTOPIC PREGNANCY N/A 12/11/2015   Procedure: DIAGNOSTIC LAPAROSCOPY WITH REMOVAL OF ECTOPIC PREGNANCY;  Surgeon: Conard Novak, MD;  Location: [facility] ORS;  Service: Gynecology;  Laterality: N/A;   DILATION AND CURETTAGE OF UTERUS     LAPAROSCOPIC BILATERAL SALPINGECTOMY Bilateral 12/11/2015   Procedure: LAPAROSCOPIC BILATERAL SALPINGECTOMY;  Surgeon: Conard Novak, MD;  Location: [facility] ORS;  Service: Gynecology;  Laterality: Bilateral;   LEFT HEART CATH AND CORONARY ANGIOGRAPHY N/A 03/06/2021   Procedure: LEFT HEART CATH AND CORONARY ANGIOGRAPHY;  Surgeon: Iran Ouch, MD;  Location: [facility] INVASIVE CV LAB;  Service: Cardiovascular;  Laterality: N/A;    OB History   No obstetric history on file.      Home Medications    Prior to Admission medications   Medication Sig Start Date End Date Taking? Authorizing Provider  amoxicillin-clavulanate (AUGMENTIN) 875-125 MG tablet Take 1 tablet by mouth every 12 (twelve) hours for 7 days. 04/17/21 04/24/21 Yes Shirlee Latch, PA-C  aspirin 81 MG chewable tablet Chew 1 tablet (81 mg total) by mouth daily. 03/09/21  Yes Furth, Cadence H, PA-C  atorvastatin (LIPITOR) 80 MG tablet Take 1 tablet (80 mg total) by mouth daily. 03/09/21  Yes Fransico Michael,  Cadence H, PA-C  ezetimibe (ZETIA) 10 MG tablet Take 1 tablet (10 mg total) by mouth daily. 04/12/21 07/11/21 Yes Creig Hines, NP  HYDROcodone-acetaminophen (NORCO/VICODIN) 5-325 MG tablet Take 1 tablet by mouth every 6 (six) hours as needed for up to 2 days. 04/17/21 04/19/21 Yes Eusebio Friendly B, PA-C  metoprolol succinate (TOPROL-XL) 25 MG 24 hr tablet Take 0.5 tablets (12.5 mg total) by mouth daily. Take with or immediately following a meal. 03/11/21  Yes Creig Hines, NP  prasugrel (EFFIENT) 10 MG TABS tablet Take 1 tablet (10 mg total) by mouth daily.  03/09/21  Yes Furth, Cadence H, PA-C  nitroGLYCERIN (NITROSTAT) 0.4 MG SL tablet Place 1 tablet (0.4 mg total) under the tongue every 5 (five) minutes as needed for chest pain. 03/08/21   Furth, Cadence H, PA-C  sertraline (ZOLOFT) 50 MG tablet Take 50 mg by mouth daily. 04/09/21   [provider]    Family History Family History  Problem Relation Age of Onset   Healthy Mother    Healthy Father     Social History Social History   Tobacco Use   Smoking status: Every Day    Packs/day: 1.00    Years: 15.00    Pack years: 15.00    Types: Cigarettes    Last attempt to quit: 03/04/2021    Years since quitting: 0.1   Smokeless tobacco: Never   Tobacco comments:    04/09/21 5 cigarettes per day  Vaping Use   Vaping Use: Never used  Substance Use Topics   Alcohol use: No   Drug use: No     Allergies   Patient has no known allergies.   Review of Systems Review of Systems  Constitutional:  Negative for fatigue and fever.  HENT:  Positive for dental problem. Negative for facial swelling.   Neurological:  Negative for headaches.  Hematological:  Positive for adenopathy.    Physical Exam Triage Vital Signs ED Triage Vitals  Enc Vitals Group     BP      Pulse      Resp      Temp      Temp src      SpO2      Weight      Height      Head Circumference      Peak Flow      Pain Score      Pain Loc      Pain Edu?      Excl. in GC?    No data found.  Updated Vital Signs BP 116/78 (BP Location: Left Arm)    Pulse 97    Temp 99.5 F (37.5 C) (Oral)    Resp 16    LMP 03/07/2021    SpO2 98%      Physical Exam Vitals and nursing note reviewed.  Constitutional:      General: She is not in acute distress.    Appearance: Normal appearance. She is not ill-appearing or toxic-appearing.  HENT:     Head: Normocephalic and atraumatic.     Nose: Nose normal.     Mouth/Throat:     Mouth: Mucous membranes are moist.     Pharynx: Oropharynx is clear.      Comments: Right lower wisdom tooth visible with surrounding erythema, swelling and TTP Eyes:     General: No scleral icterus.       Right eye: No discharge.        Left  eye: No discharge.     Conjunctiva/sclera: Conjunctivae normal.  Cardiovascular:     Rate and Rhythm: Normal rate and regular rhythm.     Heart sounds: Normal heart sounds.  Pulmonary:     Effort: Pulmonary effort is normal. No respiratory distress.     Breath sounds: Normal breath sounds.  Musculoskeletal:     Cervical back: Neck supple.  Skin:    General: Skin is dry.  Neurological:     General: No focal deficit present.     Mental Status: She is alert. Mental status is at baseline.     Motor: No weakness.     Gait: Gait normal.  Psychiatric:        Mood and Affect: Mood normal.        Behavior: Behavior normal.        Thought Content: Thought content normal.     UC Treatments / Results  Labs (all labs ordered are listed, but only abnormal results are displayed) Labs Reviewed - No data to display  EKG   Radiology No results found.  Procedures Procedures (including critical care time)  Medications Ordered in UC Medications - No data to display  Initial Impression / Assessment and Plan / UC Course  I have reviewed the triage vital signs and the nursing notes.  Pertinent labs & imaging results that were available during my care of the patient were reviewed by me and considered in my medical decision making (see chart for details).  40 year old female presenting for dental pain of a wisdom tooth to the right lower side.  On exam appears to have a infection as well.  Treating her with Augmentin.  Also sent short supply of Norco after reviewing controlled substance database and finding her to be low risk for abuse.  Advised applying ice to her face and using Orajel.  Advised reaching out to dentist for appointment.  Will likely need to have the tooth removed.  Reviewed ED precautions.  Final Clinical  Impressions(s) / UC Diagnoses   Final diagnoses:  Infected tooth  Pain, dental     Discharge Instructions      -Your wisdom tooth looks to be infected.  Will likely need to be removed.  I have sent antibiotics to the infection as well as short supply of something for pain. - Contact your dentist for appointment. - Go to ER for any fever, facial swelling or worsening dental pain.      ED Prescriptions     Medication Sig Dispense Auth. Provider   amoxicillin-clavulanate (AUGMENTIN) 875-125 MG tablet Take 1 tablet by mouth every 12 (twelve) hours for 7 days. 14 tablet Eusebio Friendly B, PA-C   HYDROcodone-acetaminophen (NORCO/VICODIN) 5-325 MG tablet Take 1 tablet by mouth every 6 (six) hours as needed for up to 2 days. 6 tablet Shirlee Latch, PA-C      I have reviewed the PDMP during this encounter.   Shirlee Latch, PA-C 04/17/21 [phone]

## 2021-04-17 NOTE — Discharge Instructions (Addendum)
-  Your wisdom tooth looks to be infected.  Will likely need to be removed.  I have sent antibiotics to the infection as well as short supply of something for pain. - Contact your dentist for appointment. - Go to ER for any fever, facial swelling or worsening dental pain.

## 2021-04-17 NOTE — ED Triage Notes (Signed)
Pt c/o right side dental pain x 3 days

## 2021-04-21 ENCOUNTER — Encounter: Payer: Self-pay | Admitting: *Deleted

## 2021-04-21 ENCOUNTER — Other Ambulatory Visit: Payer: Self-pay

## 2021-04-21 ENCOUNTER — Encounter: Payer: BC Managed Care – PPO | Admitting: *Deleted

## 2021-04-21 DIAGNOSIS — I213 ST elevation (STEMI) myocardial infarction of unspecified site: Secondary | ICD-10-CM

## 2021-04-21 DIAGNOSIS — Z955 Presence of coronary angioplasty implant and graft: Secondary | ICD-10-CM

## 2021-04-21 NOTE — Progress Notes (Signed)
Cardiac Individual Treatment Plan  Patient Details  Name: Morgan Mcdowell MRN: [id number] Date of Birth: February 06, 1981 Referring Provider:   Flowsheet Row Cardiac Rehab from 04/01/2021 in [facility] Cardiac and Pulmonary Rehab  Referring Provider Arida       Initial Encounter Date:  Flowsheet Row Cardiac Rehab from 04/01/2021 in [facility] Cardiac and Pulmonary Rehab  Date 04/01/21       Visit Diagnosis: ST elevation myocardial infarction (STEMI), unspecified artery [facility])  Status post coronary artery stent placement  Patient's Home Medications on Admission:  Current Outpatient Medications:    amoxicillin-clavulanate (AUGMENTIN) 875-125 MG tablet, Take 1 tablet by mouth every 12 (twelve) hours for 7 days., Disp: 14 tablet, Rfl: 0   aspirin 81 MG chewable tablet, Chew 1 tablet (81 mg total) by mouth daily., Disp: 90 tablet, Rfl: 3   atorvastatin (LIPITOR) 80 MG tablet, Take 1 tablet (80 mg total) by mouth daily., Disp: 90 tablet, Rfl: 3   ezetimibe (ZETIA) 10 MG tablet, Take 1 tablet (10 mg total) by mouth daily., Disp: 90 tablet, Rfl: 3   metoprolol succinate (TOPROL-XL) 25 MG 24 hr tablet, Take 0.5 tablets (12.5 mg total) by mouth daily. Take with or immediately following a meal., Disp: 45 tablet, Rfl: 3   nitroGLYCERIN (NITROSTAT) 0.4 MG SL tablet, Place 1 tablet (0.4 mg total) under the tongue every 5 (five) minutes as needed for chest pain., Disp: 25 tablet, Rfl: 2   prasugrel (EFFIENT) 10 MG TABS tablet, Take 1 tablet (10 mg total) by mouth daily., Disp: 90 tablet, Rfl: 3   sertraline (ZOLOFT) 50 MG tablet, Take 50 mg by mouth daily., Disp: , Rfl:   Past Medical History: Past Medical History:  Diagnosis Date   CAD (coronary artery disease)    a. 02/2021 Inf STEMI/PCI: LM nl, LCX nl, OM1/2/3 nl, RCA 30p, RPAV 100 (2.5x12 Onyx Frontier DES), RPL1 nl, RPL2 100. EF 50-55%.   Ectopic pregnancy    GERD (gastroesophageal reflux disease)    OCC-TUMS PRN   History of echocardiogram    a.  02/2021 Echo: EF 60-65%, no nrwma, nl RV size/fxn, triv MR.   Hyperlipidemia LDL goal <70    Latent tuberculosis by blood test    Psoriasis    Tobacco abuse     Tobacco Use: Social History   Tobacco Use  Smoking Status Every Day   Packs/day: 1.00   Years: 15.00   Pack years: 15.00   Types: Cigarettes   Last attempt to quit: 03/04/2021   Years since quitting: 0.1  Smokeless Tobacco Never  Tobacco Comments   04/09/21 5 cigarettes per day    Labs: Recent Review Flowsheet Data     Labs for ITP Cardiac and Pulmonary Rehab Latest Ref Rng & Units 03/06/2021 04/09/2021   Cholestrol 100 - 199 mg/dL 626(R) 485   LDLCALC 0 - 99 mg/dL 462(V) 93   HDL >03 mg/dL 50(K) 93(G)   Trlycerides 0 - 149 mg/dL 84 182(X)   Hemoglobin A1c 4.8 - 5.6 % 6.2(H) -        Exercise Target Goals: Exercise Program Goal: Individual exercise prescription set using results from initial 6 min walk test and THRR while considering  patients activity barriers and safety.   Exercise Prescription Goal: Initial exercise prescription builds to 30-45 minutes a day of aerobic activity, 2-3 days per week.  Home exercise guidelines will be given to patient during program as part of exercise prescription that the participant will acknowledge.   Education: Aerobic  Exercise: - Group verbal and visual presentation on the components of exercise prescription. Introduces F.I.T.T principle from ACSM for exercise prescriptions.  Reviews F.I.T.T. principles of aerobic exercise including progression. Written material given at graduation.   Education: Resistance Exercise: - Group verbal and visual presentation on the components of exercise prescription. Introduces F.I.T.T principle from ACSM for exercise prescriptions  Reviews F.I.T.T. principles of resistance exercise including progression. Written material given at graduation.    Education: Exercise & Equipment Safety: - Individual verbal instruction and demonstration  of equipment use and safety with use of the equipment. Flowsheet Row Cardiac Rehab from 04/14/2021 in [facility] Cardiac and Pulmonary Rehab  Date 04/01/21  Educator AS  Instruction Review Code 5- Refused Teaching       Education: Exercise Physiology & General Exercise Guidelines: - Group verbal and written instruction with models to review the exercise physiology of the cardiovascular system and associated critical values. Provides general exercise guidelines with specific guidelines to those with heart or lung disease.  Flowsheet Row Cardiac Rehab from 04/14/2021 in [facility] Cardiac and Pulmonary Rehab  Date 04/14/21  Educator AS  Instruction Review Code 1- Verbalizes Understanding       Education: Flexibility, Balance, Mind/Body Relaxation: - Group verbal and visual presentation with interactive activity on the components of exercise prescription. Introduces F.I.T.T principle from ACSM for exercise prescriptions. Reviews F.I.T.T. principles of flexibility and balance exercise training including progression. Also discusses the mind body connection.  Reviews various relaxation techniques to help reduce and manage stress (i.e. Deep breathing, progressive muscle relaxation, and visualization). Balance handout provided to take home. Written material given at graduation.   Activity Barriers & Risk Stratification:  Activity Barriers & Cardiac Risk Stratification - 03/17/21 1407       Activity Barriers & Cardiac Risk Stratification   Activity Barriers None    Cardiac Risk Stratification Moderate             6 Minute Walk:  6 Minute Walk     Row Name 04/01/21 1505         6 Minute Walk   Phase Initial     Distance 1375 feet     Walk Time 6 minutes     # of Rest Breaks 0     MPH 2.6     METS 4.53     RPE 7     Perceived Dyspnea  0     VO2 Peak 15.86     Symptoms No     Resting HR 82 bpm     Resting BP 90/56     Resting Oxygen Saturation  97 %     Exercise Oxygen Saturation   during 6 min walk 98 %     Max Ex. HR 112 bpm     Max Ex. BP 114/60     2 Minute Post BP 90/56              Oxygen Initial Assessment:   Oxygen Re-Evaluation:   Oxygen Discharge (Final Oxygen Re-Evaluation):   Initial Exercise Prescription:  Initial Exercise Prescription - 04/01/21 1500       Date of Initial Exercise RX and Referring Provider   Date 04/01/21    Referring Provider Arida      Treadmill   MPH 2.6    Grade 2    Minutes 15    METs 3.7      Recumbant Bike   Level 3    RPM 60    Watts 45  Minutes 15    METs 3.7      NuStep   Level 3    SPM 80    Minutes 15    METs 3.7      Elliptical   Level 1    Speed 2.5    Minutes 15    METs 3.7      REL-XR   Level 3    Speed 50    Minutes 15    METs 3.7      T5 Nustep   Level 2    SPM 80    Minutes 15    METs 3.7      Prescription Details   Frequency (times per week) 3    Duration Progress to 30 minutes of continuous aerobic without signs/symptoms of physical distress      Intensity   THRR 40-80% of Max Heartrate 121-160    Ratings of Perceived Exertion 11-15    Perceived Dyspnea 0-4      Resistance Training   Training Prescription Yes    Weight 3 lb    Reps 10-15             Perform Capillary Blood Glucose checks as needed.  Exercise Prescription Changes:   Exercise Prescription Changes     Row Name 04/01/21 1500 04/07/21 1300           Response to Exercise   Blood Pressure (Admit) 90/56 96/64      Blood Pressure (Exercise) 114/60 122/68      Blood Pressure (Exit) 90/56 102/60      Heart Rate (Admit) 82 bpm 84 bpm      Heart Rate (Exercise) 112 bpm 122 bpm      Heart Rate (Exit) 86 bpm 103 bpm      Oxygen Saturation (Admit) 97 % --      Oxygen Saturation (Exercise) 98 % --      Rating of Perceived Exertion (Exercise) 7 12      Perceived Dyspnea (Exercise) 0 --      Symptoms none none      Comments -- first day      Duration -- Progress to 30 minutes of   aerobic without signs/symptoms of physical distress      Intensity -- THRR unchanged        Progression   Progression -- Continue to progress workloads to maintain intensity without signs/symptoms of physical distress.      Average METs -- 3.86        Resistance Training   Training Prescription -- Yes      Weight -- 3 lb      Reps -- 10-15        REL-XR   Level -- 3      Minutes -- 15      METs -- 4.6        Track   Laps -- 39      Minutes -- 15      METs -- 3.12               Exercise Comments:   Exercise Goals and Review:   Exercise Goals     Row Name 04/01/21 1516             Exercise Goals   Increase Physical Activity Yes       Intervention Provide advice, education, support and counseling about physical activity/exercise needs.;Develop an individualized exercise prescription for aerobic and resistive training based on initial evaluation  findings, risk stratification, comorbidities and participant's personal goals.       Expected Outcomes Short Term: Attend rehab on a regular basis to increase amount of physical activity.;Long Term: Add in home exercise to make exercise part of routine and to increase amount of physical activity.;Long Term: Exercising regularly at least 3-5 days a week.       Increase Strength and Stamina Yes       Intervention Provide advice, education, support and counseling about physical activity/exercise needs.;Develop an individualized exercise prescription for aerobic and resistive training based on initial evaluation findings, risk stratification, comorbidities and participant's personal goals.       Expected Outcomes Short Term: Increase workloads from initial exercise prescription for resistance, speed, and METs.;Short Term: Perform resistance training exercises routinely during rehab and add in resistance training at home;Long Term: Improve cardiorespiratory fitness, muscular endurance and strength as measured by increased METs and  functional capacity ( )       Able to understand and use rate of perceived exertion (RPE) scale Yes       Intervention Provide education and explanation on how to use RPE scale       Expected Outcomes Short Term: Able to use RPE daily in rehab to express subjective intensity level;Long Term:  Able to use RPE to guide intensity level when exercising independently       Able to understand and use Dyspnea scale Yes       Intervention Provide education and explanation on how to use Dyspnea scale       Expected Outcomes Short Term: Able to use Dyspnea scale daily in rehab to express subjective sense of shortness of breath during exertion;Long Term: Able to use Dyspnea scale to guide intensity level when exercising independently       Knowledge and understanding of Target Heart Rate Range (THRR) Yes       Intervention Provide education and explanation of THRR including how the numbers were predicted and where they are located for reference       Expected Outcomes Short Term: Able to state/look up THRR;Short Term: Able to use daily as guideline for intensity in rehab;Long Term: Able to use THRR to govern intensity when exercising independently       Able to check pulse independently Yes       Intervention Provide education and demonstration on how to check pulse in carotid and radial arteries.;Review the importance of being able to check your own pulse for safety during independent exercise       Expected Outcomes Short Term: Able to explain why pulse checking is important during independent exercise;Long Term: Able to check pulse independently and accurately       Understanding of Exercise Prescription Yes       Intervention Provide education, explanation, and written materials on patient's individual exercise prescription       Expected Outcomes Short Term: Able to explain program exercise prescription;Long Term: Able to explain home exercise prescription to exercise independently                 Exercise Goals Re-Evaluation :  Exercise Goals Re-Evaluation     Row Name 04/05/21 1714             Exercise Goal Re-Evaluation   Exercise Goals Review Increase Physical Activity;Able to understand and use rate of perceived exertion (RPE) scale;Knowledge and understanding of Target Heart Rate Range (THRR);Understanding of Exercise Prescription;Increase Strength and Stamina;Able to check pulse independently  Comments Reviewed RPE and dyspnea scales, THR and program prescription with pt today.  Pt voiced understanding and was given a copy of goals to take home.       Expected Outcomes Short: Use RPE daily to regulate intensity. Long: Follow program prescription in THR.                Discharge Exercise Prescription (Final Exercise Prescription Changes):  Exercise Prescription Changes - 04/07/21 1300       Response to Exercise   Blood Pressure (Admit) 96/64    Blood Pressure (Exercise) 122/68    Blood Pressure (Exit) 102/60    Heart Rate (Admit) 84 bpm    Heart Rate (Exercise) 122 bpm    Heart Rate (Exit) 103 bpm    Rating of Perceived Exertion (Exercise) 12    Symptoms none    Comments first day    Duration Progress to 30 minutes of  aerobic without signs/symptoms of physical distress    Intensity THRR unchanged      Progression   Progression Continue to progress workloads to maintain intensity without signs/symptoms of physical distress.    Average METs 3.86      Resistance Training   Training Prescription Yes    Weight 3 lb    Reps 10-15      REL-XR   Level 3    Minutes 15    METs 4.6      Track   Laps 39    Minutes 15    METs 3.12             Nutrition:  Target Goals: Understanding of nutrition guidelines, daily intake of sodium 1500mg , cholesterol 200mg , calories 30% from fat and 7% or less from saturated fats, daily to have 5 or more servings of fruits and vegetables.  Education: All About Nutrition: -Group instruction provided by  verbal, written material, interactive activities, discussions, models, and posters to present general guidelines for heart healthy nutrition including fat, fiber, MyPlate, the role of sodium in heart healthy nutrition, utilization of the nutrition label, and utilization of this knowledge for meal planning. Follow up email sent as well. Written material given at graduation. Flowsheet Row Cardiac Rehab from 04/14/2021 in [facility] Cardiac and Pulmonary Rehab  Education need identified 04/01/21       Biometrics:  Pre Biometrics - 04/01/21 1517       Pre Biometrics   Height 5\' 5"  (1.651 m)    Weight 173 lb 1.6 oz (78.5 kg)    BMI (Calculated) 28.81    Single Leg Stand 30 seconds              Nutrition Therapy Plan and Nutrition Goals:  Nutrition Therapy & Goals - 04/14/21 1637       Nutrition Therapy   Diet Heart healthy, low Na    Drug/Food Interactions Statins/Certain Fruits    Protein (specify units) 60g    Fiber 30 grams    Whole Grain Foods 3 servings    Saturated Fats 12 max. grams    Fruits and Vegetables 8 servings/day    Sodium 1.5 grams      Personal Nutrition Goals   Nutrition Goal ST: 2-3 small frequent meals/snacks to make sure she is eating during the day - fruit with peanut butter, boiled eggs, wrap, vegetables with hummus, etc. LT: eat during the day, limit saturated fat <12g/day, limit Na <1.5/day, include 8 fruits/vegetables per day    Comments 39 y.o. F admitted to rehab  for STEMI also presenting with CAD, HLD, GERD. Past surgical hx includes cholecystectomy. Relevant medications include lipitor, hydrocodone. She smokes 4-5 cigarettes per day. Morgan Mcdowell reports that she is getting food out more than usual due to her childrens sports schedule/traveling. Discussed better options to get when going out to eat, grilled foods, vegetables, lean proteins, no cream sauces, no fried foods. She reports eating vegetable plates and nothing during the day due to being busy at work.  Discussed small frequent meals/snacks to make sure she is eating during the day - fruit with peanut butter, boiled eggs, wrap, vegetables with hummus, etc. Dsicussed heart healthy eating.             Nutrition Assessments:  MEDIFICTS Score Key: ?70 Need to make dietary changes  40-70 Heart Healthy Diet ? 40 Therapeutic Level Cholesterol Diet  Flowsheet Row Cardiac Rehab from 04/01/2021 in [facility] Cardiac and Pulmonary Rehab  Picture Your Plate Total Score on Admission 44      Picture Your Plate Scores: <16 Unhealthy dietary pattern with much room for improvement. 41-50 Dietary pattern unlikely to meet recommendations for good health and room for improvement. 51-60 More healthful dietary pattern, with some room for improvement.  >60 Healthy dietary pattern, although there may be some specific behaviors that could be improved.    Nutrition Goals Re-Evaluation:   Nutrition Goals Discharge (Final Nutrition Goals Re-Evaluation):   Psychosocial: Target Goals: Acknowledge presence or absence of significant depression and/or stress, maximize coping skills, provide positive support system. Participant is able to verbalize types and ability to use techniques and skills needed for reducing stress and depression.   Education: Stress, Anxiety, and Depression - Group verbal and visual presentation to define topics covered.  Reviews how body is impacted by stress, anxiety, and depression.  Also discusses healthy ways to reduce stress and to treat/manage anxiety and depression.  Written material given at graduation. Flowsheet Row Cardiac Rehab from 04/14/2021 in [facility] Cardiac and Pulmonary Rehab  Date 04/07/21  Educator [facility]  Instruction Review Code 1- Verbalizes Understanding       Education: Sleep Hygiene -Provides group verbal and written instruction about how sleep can affect your health.  Define sleep hygiene, discuss sleep cycles and impact of sleep habits. Review good sleep hygiene  tips.    Initial Review & Psychosocial Screening:  Initial Psych Review & Screening - 03/17/21 1412       Initial Review   Current issues with Current Stress Concerns      Family Dynamics   Good Support System? Yes   husband     Barriers   Psychosocial barriers to participate in program There are no identifiable barriers or psychosocial needs.;The patient should benefit from training in stress management and relaxation.      Screening Interventions   Interventions Encouraged to exercise;Provide feedback about the scores to participant;To provide support and resources with identified psychosocial needs    Expected Outcomes Short Term goal: Utilizing psychosocial counselor, staff and physician to assist with identification of specific Stressors or current issues interfering with healing process. Setting desired goal for each stressor or current issue identified.;Long Term Goal: Stressors or current issues are controlled or eliminated.;Short Term goal: Identification and review with participant of any Quality of Life or Depression concerns found by scoring the questionnaire.;Long Term goal: The participant improves quality of Life and PHQ9 Scores as seen by post scores and/or verbalization of changes             Quality of  Life Scores:   Quality of Life - 04/01/21 1518       Quality of Life   Select Quality of Life      Quality of Life Scores   Health/Function Pre 24.07 %    Socioeconomic Pre 20.69 %    Psych/Spiritual Pre 25.64 %    Family Pre 30 %    GLOBAL Pre 24.46 %            Scores of 19 and below usually indicate a poorer quality of life in these areas.  A difference of  2-3 points is a clinically meaningful difference.  A difference of 2-3 points in the total score of the Quality of Life Index has been associated with significant improvement in overall quality of life, self-image, physical symptoms, and general health in studies assessing change in quality of  life.  PHQ-9: Recent Review Flowsheet Data     Depression screen [facility] 2/9 04/01/2021   Decreased Interest 0   Down, Depressed, Hopeless 1   PHQ - 2 Score 1   Altered sleeping 0   Tired, decreased energy 1   Change in appetite 1   Feeling bad or failure about yourself  0   Trouble concentrating 0   Moving slowly or fidgety/restless 0   Suicidal thoughts 0   PHQ-9 Score 3   Difficult doing work/chores Not difficult at all      Interpretation of Total Score  Total Score Depression Severity:  1-4 = Minimal depression, 5-9 = Mild depression, 10-14 = Moderate depression, 15-19 = Moderately severe depression, 20-27 = Severe depression   Psychosocial Evaluation and Intervention:  Psychosocial Evaluation - 03/17/21 1426       Psychosocial Evaluation & Interventions   Interventions Encouraged to exercise with the program and follow exercise prescription    Comments Morgan Mcdowell is coming to cardiac rehab post STEMI w/ stent. She reports feeling well and is planning on returning back to work Monday after Thanksgiving. She has a great support system at home. She stated her biggest stressor is trying to figure out all she needs to do and change after this heart attack, so she is ready to get started in the program. Her daughter keeps her busy with travel ball and she typically works 6a-4p so eating heatlhy can be a struggle.    Expected Outcomes Short: attend cardiac rehab for education and exercise. Long: develop and maintain positive self care habits.    Continue Psychosocial Services  Follow up required by staff             Psychosocial Re-Evaluation:   Psychosocial Discharge (Final Psychosocial Re-Evaluation):   Vocational Rehabilitation: Provide vocational rehab assistance to qualifying candidates.   Vocational Rehab Evaluation & Intervention:  Vocational Rehab - 03/17/21 1412       Initial Vocational Rehab Evaluation & Intervention   Assessment shows need for Vocational  Rehabilitation No             Education: Education Goals: Education classes will be provided on a variety of topics geared toward better understanding of heart health and risk factor modification. Participant will state understanding/return demonstration of topics presented as noted by education test scores.  Learning Barriers/Preferences:  Learning Barriers/Preferences - 03/17/21 1411       Learning Barriers/Preferences   Learning Barriers None    Learning Preferences None             General Cardiac Education Topics:  AED/CPR: - Group verbal and written instruction  with the use of models to demonstrate the basic use of the AED with the basic ABC's of resuscitation.   Anatomy and Cardiac Procedures: - Group verbal and visual presentation and models provide information about basic cardiac anatomy and function. Reviews the testing methods done to diagnose heart disease and the outcomes of the test results. Describes the treatment choices: Medical Management, Angioplasty, or Coronary Bypass Surgery for treating various heart conditions including Myocardial Infarction, Angina, Valve Disease, and Cardiac Arrhythmias.  Written material given at graduation.   Medication Safety: - Group verbal and visual instruction to review commonly prescribed medications for heart and lung disease. Reviews the medication, class of the drug, and side effects. Includes the steps to properly store meds and maintain the prescription regimen.  Written material given at graduation.   Intimacy: - Group verbal instruction through game format to discuss how heart and lung disease can affect sexual intimacy. Written material given at graduation..   Know Your Numbers and Heart Failure: - Group verbal and visual instruction to discuss disease risk factors for cardiac and pulmonary disease and treatment options.  Reviews associated critical values for Overweight/Obesity, Hypertension, Cholesterol, and  Diabetes.  Discusses basics of heart failure: signs/symptoms and treatments.  Introduces Heart Failure Zone chart for action plan for heart failure.  Written material given at graduation.   Infection Prevention: - Provides verbal and written material to individual with discussion of infection control including proper hand washing and proper equipment cleaning during exercise session. Flowsheet Row Cardiac Rehab from 04/14/2021 in [facility] Cardiac and Pulmonary Rehab  Date 04/01/21  Educator AS  Instruction Review Code 1- Verbalizes Understanding       Falls Prevention: - Provides verbal and written material to individual with discussion of falls prevention and safety. Flowsheet Row Cardiac Rehab from 04/14/2021 in [facility] Cardiac and Pulmonary Rehab  Date 04/01/21  Educator AS  Instruction Review Code 1- Verbalizes Understanding       Other: -Provides group and verbal instruction on various topics (see comments)   Knowledge Questionnaire Score:  Knowledge Questionnaire Score - 04/01/21 1519       Knowledge Questionnaire Score   Pre Score 25/26             Core Components/Risk Factors/Patient Goals at Admission:  Personal Goals and Risk Factors at Admission - 04/01/21 1517       Core Components/Risk Factors/Patient Goals on Admission   Tobacco Cessation Yes    Intervention Assist the participant in steps to quit. Provide individualized education and counseling about committing to Tobacco Cessation, relapse prevention, and pharmacological support that can be provided by physician.;Education officer, environmental, assist with locating and accessing local/national Quit Smoking programs, and support quit date choice.   gave [employer] packet   Expected Outcomes Short Term: Will demonstrate readiness to quit, by selecting a quit date.;Short Term: Will quit all tobacco product use, adhering to prevention of relapse plan.;Long Term: Complete abstinence from all tobacco products for  at least 12 months from quit date.    Lipids Yes    Intervention Provide education and support for participant on nutrition & aerobic/resistive exercise along with prescribed medications to achieve LDL 70mg , HDL >40mg .    Expected Outcomes Short Term: Participant states understanding of desired cholesterol values and is compliant with medications prescribed. Participant is following exercise prescription and nutrition guidelines.;Long Term: Cholesterol controlled with medications as prescribed, with individualized exercise RX and with personalized nutrition plan. Value goals: LDL < 70mg , HDL > 40  mg.             Education:Diabetes - Individual verbal and written instruction to review signs/symptoms of diabetes, desired ranges of glucose level fasting, after meals and with exercise. Acknowledge that pre and post exercise glucose checks will be done for 3 sessions at entry of program.   Core Components/Risk Factors/Patient Goals Review:    Core Components/Risk Factors/Patient Goals at Discharge (Final Review):    ITP Comments:  ITP Comments     Row Name 03/17/21 1424 04/01/21 1531 04/05/21 1714 04/14/21 1651 04/21/21 0647   ITP Comments Initial telephone orientation completed. Diagnosis can be found in [facility] 11/12. EP orientation scheduled for Thursday 12/8 at 2pm. Completed and gym orientation. Initial ITP created and sent for review to Dr. Bethann Punches, Medical Director. First full day of exercise!  Patient was oriented to gym and equipment including functions, settings, policies, and procedures.  Patient's individual exercise prescription and treatment plan were reviewed.  All starting workloads were established based on the results of the 6 minute walk test done at initial orientation visit.  The plan for exercise progression was also introduced and progression will be customized based on patient's performance and goals. Completed initial RD consultation 30 Day review completed. Medical  Director ITP review done, changes made as directed, and signed approval by Medical Director.            Comments:

## 2021-04-21 NOTE — Progress Notes (Signed)
Daily Session Note  Patient Details  Name: Morgan Mcdowell MRN: [id number] Date of Birth: 1980-06-01 Referring Provider:   Flowsheet Row Cardiac Rehab from 04/01/2021 in [facility] Cardiac and Pulmonary Rehab  Referring Provider Arida       Encounter Date: 04/21/2021  Check In:  Session Check In - 04/21/21 1703       Check-In   Supervising physician immediately available to respond to emergencies See telemetry face sheet for immediately available ER MD    Location [facility]-Cardiac & Pulmonary Rehab    Staff Present Renita Papa, RN BSN;Joseph Tessie Fass, RCP,RRT,BSRT;Melissa Clifton, [place], LDN    Virtual Visit No    Medication changes reported     No    Fall or balance concerns reported    No    Warm-up and Cool-down Performed on first and last piece of equipment    Resistance Training Performed Yes    VAD Patient? No    PAD/SET Patient? No      Pain Assessment   Currently in Pain? No/denies                Social History   Tobacco Use  Smoking Status Every Day   Packs/day: 1.00   Years: 15.00   Pack years: 15.00   Types: Cigarettes   Last attempt to quit: 03/04/2021   Years since quitting: 0.1  Smokeless Tobacco Never  Tobacco Comments   04/09/21 5 cigarettes per day    Goals Met:  Independence with exercise equipment Exercise tolerated well No report of concerns or symptoms today Strength training completed today  Goals Unmet:  Not Applicable  Comments: Pt able to follow exercise prescription today without complaint.  Will continue to monitor for progression.    Dr. Emily Filbert is Medical Director for [place].  Dr. Ottie Glazier is Medical Director for [facility] Pulmonary Rehabilitation.

## 2021-04-22 ENCOUNTER — Telehealth: Payer: Self-pay | Admitting: Cardiovascular Disease

## 2021-04-22 NOTE — Telephone Encounter (Signed)
Patient calling  States for the last week or so she has been getting this symptom of a really shallow breath, almost like a hiccup Would like to know if this is a cause for any concern Please call to discuss

## 2021-04-22 NOTE — Telephone Encounter (Signed)
Patient reports once a day she has a shallow breath then it goes back to normal. She has noted that this happens when she is somewhat anxious. Discussed calming techniques and that if she notices then to try and deep breathe which can sometimes help with anxiety as well. Encouraged her to speak with primary care if this continues for other recommendations. She just wanted to make sure this didn't have anything to do with her stent. Provided reassurance and emotional support and no further questions at this time.

## 2021-04-27 ENCOUNTER — Telehealth: Payer: Self-pay | Admitting: Nurse Practitioner

## 2021-04-27 NOTE — Telephone Encounter (Signed)
Attempted to call the patient to clarify: 1) How long has she been out of work? 2) What type of work is she currently doing?  No answer- I left a message advising I would send her a MyChart message with a couple of questions if she could please clarify these and send back a response. At that point, we can have Dr. Kirke Corin an APP look at the request as Ward Givens, NP is currently out of the office this week.

## 2021-04-27 NOTE — Telephone Encounter (Signed)
Patient needs a letter to return to work with no restrictions.  Please send via mychart.

## 2021-04-28 ENCOUNTER — Encounter: Payer: BC Managed Care – PPO | Attending: Cardiovascular Disease

## 2021-04-28 ENCOUNTER — Other Ambulatory Visit: Payer: Self-pay

## 2021-04-28 DIAGNOSIS — I213 ST elevation (STEMI) myocardial infarction of unspecified site: Secondary | ICD-10-CM

## 2021-04-28 DIAGNOSIS — Z48812 Encounter for surgical aftercare following surgery on the circulatory system: Secondary | ICD-10-CM | POA: Diagnosis not present

## 2021-04-28 DIAGNOSIS — I252 Old myocardial infarction: Secondary | ICD-10-CM | POA: Diagnosis present

## 2021-04-28 DIAGNOSIS — Z955 Presence of coronary angioplasty implant and graft: Secondary | ICD-10-CM | POA: Diagnosis not present

## 2021-04-28 NOTE — Progress Notes (Signed)
Daily Session Note  Patient Details  Name: Morgan Mcdowell MRN: [id number] Date of Birth: 1980-09-13 Referring Provider:   Flowsheet Row Cardiac Rehab from 04/01/2021 in [facility] Cardiac and Pulmonary Rehab  Referring Provider Fletcher Anon       Encounter Date: 04/28/2021  Check In:  Session Check In - 04/28/21 1651       Check-In   Supervising physician immediately available to respond to emergencies See telemetry face sheet for immediately available ER MD    Location [facility]-Cardiac & Pulmonary Rehab    Staff Present Birdie Sons, MPA, RN;Joseph Lou Miner, MS, ASCM CEP, Exercise Physiologist    Virtual Visit No    Medication changes reported     No    Fall or balance concerns reported    No    Warm-up and Cool-down Performed on first and last piece of equipment    Resistance Training Performed Yes    VAD Patient? No    PAD/SET Patient? No      Pain Assessment   Currently in Pain? No/denies                Social History   Tobacco Use  Smoking Status Every Day   Packs/day: 1.00   Years: 15.00   Pack years: 15.00   Types: Cigarettes   Last attempt to quit: 03/04/2021   Years since quitting: 0.1  Smokeless Tobacco Never  Tobacco Comments   04/09/21 5 cigarettes per day    Goals Met:  Independence with exercise equipment Exercise tolerated well No report of concerns or symptoms today Strength training completed today  Goals Unmet:  Not Applicable  Comments: Pt able to follow exercise prescription today without complaint.  Will continue to monitor for progression.    Dr. Emily Filbert is Medical Director for [place].  Dr. Ottie Glazier is Medical Director for [facility] Pulmonary Rehabilitation.

## 2021-04-30 ENCOUNTER — Encounter: Payer: Self-pay | Admitting: *Deleted

## 2021-04-30 NOTE — Telephone Encounter (Signed)
Letter completed and faxed to  [phone] Attn: Morton Amy  Fax confirmation received.   Attempted to call the patient to confirm this has been done. No answer- I left a detailed message on her identified voice mail that her letter has been sent and a copy is also available on her MyChart.   I asked that she call back with any other questions/ concerns.

## 2021-04-30 NOTE — Telephone Encounter (Signed)
She can resume work with no restrictions.

## 2021-04-30 NOTE — Telephone Encounter (Signed)
I called and spoke with the patient in regards to her RTW note. Per the patient, she was only out 2 weeks, but had lifting restrictions x 1 month. 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 Her boss is just needing a note stating she can work without any restrictions at this time.  I inquired if she has been having any symptoms. Per the patient, she feels as though she gets some occasional anxiety, but no other cardiac symptoms.  The patient is s/p STEMI on 03/06/21 with DES placement.  I have advised the patient I will forward her request to the provider to see if we can go ahead and write for no restrictions for her work.  She is aware we will call her back once this is done and that a letter will automatically be sent to her MyChart.  She would also like this faxed to: [phone] ATTN: Morton Amy

## 2021-05-03 ENCOUNTER — Encounter: Payer: BC Managed Care – PPO | Admitting: *Deleted

## 2021-05-03 ENCOUNTER — Other Ambulatory Visit: Payer: Self-pay

## 2021-05-03 DIAGNOSIS — Z955 Presence of coronary angioplasty implant and graft: Secondary | ICD-10-CM

## 2021-05-03 DIAGNOSIS — Z48812 Encounter for surgical aftercare following surgery on the circulatory system: Secondary | ICD-10-CM | POA: Diagnosis not present

## 2021-05-03 DIAGNOSIS — I213 ST elevation (STEMI) myocardial infarction of unspecified site: Secondary | ICD-10-CM

## 2021-05-03 NOTE — Progress Notes (Signed)
Daily Session Note  Patient Details  Name: Morgan Mcdowell MRN: [id number] Date of Birth: 07-05-1980 Referring Provider:   Flowsheet Row Cardiac Rehab from 04/01/2021 in [facility] Cardiac and Pulmonary Rehab  Referring Provider Arida       Encounter Date: 05/03/2021  Check In:  Session Check In - 05/03/21 1706       Check-In   Supervising physician immediately available to respond to emergencies See telemetry face sheet for immediately available ER MD    Location [facility]-Cardiac & Pulmonary Rehab    Staff Present Renita Papa, RN BSN;Joseph Blades, RCP,RRT,BSRT;Kara Gloria [place], [place], ASCM CEP, Exercise Physiologist    Virtual Visit No    Medication changes reported     No    Fall or balance concerns reported    No    Tobacco Cessation No Change    Current number of cigarettes/nicotine per day     5    Warm-up and Cool-down Performed on first and last piece of equipment    Resistance Training Performed Yes    VAD Patient? No    PAD/SET Patient? No      Pain Assessment   Currently in Pain? No/denies                Social History   Tobacco Use  Smoking Status Every Day   Packs/day: 1.00   Years: 15.00   Pack years: 15.00   Types: Cigarettes   Last attempt to quit: 03/04/2021   Years since quitting: 0.1  Smokeless Tobacco Never  Tobacco Comments   04/09/21 5 cigarettes per day    Goals Met:  Independence with exercise equipment Exercise tolerated well No report of concerns or symptoms today Strength training completed today  Goals Unmet:  Not Applicable  Comments: Pt able to follow exercise prescription today without complaint.  Will continue to monitor for progression.    Dr. Emily Filbert is Medical Director for [place].  Dr. Ottie Glazier is Medical Director for [facility] Pulmonary Rehabilitation.

## 2021-05-05 ENCOUNTER — Other Ambulatory Visit: Payer: Self-pay

## 2021-05-05 DIAGNOSIS — Z955 Presence of coronary angioplasty implant and graft: Secondary | ICD-10-CM

## 2021-05-05 DIAGNOSIS — Z48812 Encounter for surgical aftercare following surgery on the circulatory system: Secondary | ICD-10-CM | POA: Diagnosis not present

## 2021-05-05 DIAGNOSIS — I213 ST elevation (STEMI) myocardial infarction of unspecified site: Secondary | ICD-10-CM

## 2021-05-05 NOTE — Progress Notes (Signed)
Daily Session Note  Patient Details  Name: Morgan Mcdowell MRN: [id number] Date of Birth: November 26, 1980 Referring Provider:   Flowsheet Row Cardiac Rehab from 04/01/2021 in [facility] Cardiac and Pulmonary Rehab  Referring Provider Fletcher Anon       Encounter Date: 05/05/2021  Check In:  Session Check In - 05/05/21 1700       Check-In   Supervising physician immediately available to respond to emergencies See telemetry face sheet for immediately available ER MD    Location [facility]-Cardiac & Pulmonary Rehab    Staff Present Birdie Sons, MPA, Nino Glow, MS, ASCM CEP, Exercise Physiologist;Joseph Tessie Fass, [place]    Virtual Visit No    Medication changes reported     No    Fall or balance concerns reported    No    Tobacco Cessation No Change    Warm-up and Cool-down Performed on first and last piece of equipment    Resistance Training Performed Yes    VAD Patient? No    PAD/SET Patient? No      Pain Assessment   Currently in Pain? No/denies                Social History   Tobacco Use  Smoking Status Every Day   Packs/day: 1.00   Years: 15.00   Pack years: 15.00   Types: Cigarettes   Last attempt to quit: 03/04/2021   Years since quitting: 0.1  Smokeless Tobacco Never  Tobacco Comments   04/09/21 5 cigarettes per day    Goals Met:  Independence with exercise equipment Exercise tolerated well No report of concerns or symptoms today Strength training completed today  Goals Unmet:  Not Applicable  Comments: Pt able to follow exercise prescription today without complaint.  Will continue to monitor for progression.    Dr. Emily Filbert is Medical Director for [place].  Dr. Ottie Glazier is Medical Director for [facility] Pulmonary Rehabilitation.

## 2021-05-10 ENCOUNTER — Other Ambulatory Visit: Payer: Self-pay

## 2021-05-10 ENCOUNTER — Encounter: Payer: BC Managed Care – PPO | Admitting: *Deleted

## 2021-05-10 DIAGNOSIS — I213 ST elevation (STEMI) myocardial infarction of unspecified site: Secondary | ICD-10-CM

## 2021-05-10 DIAGNOSIS — Z48812 Encounter for surgical aftercare following surgery on the circulatory system: Secondary | ICD-10-CM | POA: Diagnosis not present

## 2021-05-10 DIAGNOSIS — Z955 Presence of coronary angioplasty implant and graft: Secondary | ICD-10-CM

## 2021-05-10 NOTE — Progress Notes (Signed)
Daily Session Note  Patient Details  Name: Morgan Mcdowell MRN: [id number] Date of Birth: 11-Mar-1981 Referring Provider:   Flowsheet Row Cardiac Rehab from 04/01/2021 in [facility] Cardiac and Pulmonary Rehab  Referring Provider Fletcher Anon       Encounter Date: 05/10/2021  Check In:  Session Check In - 05/10/21 1714       Check-In   Supervising physician immediately available to respond to emergencies See telemetry face sheet for immediately available ER MD    Location [facility]-Cardiac & Pulmonary Rehab    Staff Present Nyoka Cowden, RN, BSN, Tyna Jaksch, MS, ASCM CEP, Exercise Physiologist;Meredith Sherryll Burger, RN BSN    Virtual Visit No    Medication changes reported     No    Fall or balance concerns reported    No    Tobacco Cessation No Change    Warm-up and Cool-down Performed on first and last piece of equipment    Resistance Training Performed Yes    VAD Patient? No    PAD/SET Patient? No      Pain Assessment   Currently in Pain? No/denies                Social History   Tobacco Use  Smoking Status Every Day   Packs/day: 1.00   Years: 15.00   Pack years: 15.00   Types: Cigarettes   Last attempt to quit: 03/04/2021   Years since quitting: 0.1  Smokeless Tobacco Never  Tobacco Comments   04/09/21 5 cigarettes per day    Goals Met:  Independence with exercise equipment Exercise tolerated well No report of concerns or symptoms today  Goals Unmet:  Not Applicable  Comments: Pt able to follow exercise prescription today without complaint.  Will continue to monitor for progression.    Dr. Emily Filbert is Medical Director for [place].  Dr. Ottie Glazier is Medical Director for [facility] Pulmonary Rehabilitation.

## 2021-05-19 ENCOUNTER — Encounter: Payer: Self-pay | Admitting: *Deleted

## 2021-05-19 ENCOUNTER — Other Ambulatory Visit: Payer: Self-pay

## 2021-05-19 DIAGNOSIS — Z48812 Encounter for surgical aftercare following surgery on the circulatory system: Secondary | ICD-10-CM | POA: Diagnosis not present

## 2021-05-19 DIAGNOSIS — Z955 Presence of coronary angioplasty implant and graft: Secondary | ICD-10-CM

## 2021-05-19 DIAGNOSIS — I213 ST elevation (STEMI) myocardial infarction of unspecified site: Secondary | ICD-10-CM

## 2021-05-19 NOTE — Progress Notes (Signed)
Cardiac Individual Treatment Plan  Patient Details  Name: Morgan Mcdowell MRN: [id number] Date of Birth: 1981/01/10 Referring Provider:   Flowsheet Row Cardiac Rehab from 04/01/2021 in [facility] Cardiac and Pulmonary Rehab  Referring Provider Arida       Initial Encounter Date:  Flowsheet Row Cardiac Rehab from 04/01/2021 in [facility] Cardiac and Pulmonary Rehab  Date 04/01/21       Visit Diagnosis: ST elevation myocardial infarction (STEMI), unspecified artery [facility])  Status post coronary artery stent placement  Patient's Home Medications on Admission:  Current Outpatient Medications:    aspirin 81 MG chewable tablet, Chew 1 tablet (81 mg total) by mouth daily., Disp: 90 tablet, Rfl: 3   atorvastatin (LIPITOR) 80 MG tablet, Take 1 tablet (80 mg total) by mouth daily., Disp: 90 tablet, Rfl: 3   ezetimibe (ZETIA) 10 MG tablet, Take 1 tablet (10 mg total) by mouth daily., Disp: 90 tablet, Rfl: 3   metoprolol succinate (TOPROL-XL) 25 MG 24 hr tablet, Take 0.5 tablets (12.5 mg total) by mouth daily. Take with or immediately following a meal., Disp: 45 tablet, Rfl: 3   nitroGLYCERIN (NITROSTAT) 0.4 MG SL tablet, Place 1 tablet (0.4 mg total) under the tongue every 5 (five) minutes as needed for chest pain., Disp: 25 tablet, Rfl: 2   prasugrel (EFFIENT) 10 MG TABS tablet, Take 1 tablet (10 mg total) by mouth daily., Disp: 90 tablet, Rfl: 3   sertraline (ZOLOFT) 50 MG tablet, Take 50 mg by mouth daily., Disp: , Rfl:   Past Medical History: Past Medical History:  Diagnosis Date   CAD (coronary artery disease)    a. 02/2021 Inf STEMI/PCI: LM nl, LCX nl, OM1/2/3 nl, RCA 30p, RPAV 100 (2.5x12 Onyx Frontier DES), RPL1 nl, RPL2 100. EF 50-55%.   Ectopic pregnancy    GERD (gastroesophageal reflux disease)    OCC-TUMS PRN   History of echocardiogram    a. 02/2021 Echo: EF 60-65%, no nrwma, nl RV size/fxn, triv MR.   Hyperlipidemia LDL goal <70    Latent tuberculosis by blood test    Psoriasis     Tobacco abuse     Tobacco Use: Social History   Tobacco Use  Smoking Status Every Day   Packs/day: 1.00   Years: 15.00   Pack years: 15.00   Types: Cigarettes   Last attempt to quit: 03/04/2021   Years since quitting: 0.2  Smokeless Tobacco Never  Tobacco Comments   04/09/21 5 cigarettes per day    Labs: Recent Review Flowsheet Data     Labs for ITP Cardiac and Pulmonary Rehab Latest Ref Rng & Units 03/06/2021 04/09/2021   Cholestrol 100 - 199 mg/dL 211(H) 154   LDLCALC 0 - 99 mg/dL 172(H) 93   HDL >39 mg/dL 22(L) 31(L)   Trlycerides 0 - 149 mg/dL 84 174(H)   Hemoglobin A1c 4.8 - 5.6 % 6.2(H) -        Exercise Target Goals: Exercise Program Goal: Individual exercise prescription set using results from initial 6 min walk test and THRR while considering  patients activity barriers and safety.   Exercise Prescription Goal: Initial exercise prescription builds to 30-45 minutes a day of aerobic activity, 2-3 days per week.  Home exercise guidelines will be given to patient during program as part of exercise prescription that the participant will acknowledge.   Education: Aerobic Exercise: - Group verbal and visual presentation on the components of exercise prescription. Introduces F.I.T.T principle from ACSM for exercise prescriptions.  Reviews F.I.T.T.  principles of aerobic exercise including progression. Written material given at graduation.   Education: Resistance Exercise: - Group verbal and visual presentation on the components of exercise prescription. Introduces F.I.T.T principle from ACSM for exercise prescriptions  Reviews F.I.T.T. principles of resistance exercise including progression. Written material given at graduation. Flowsheet Row Cardiac Rehab from 04/28/2021 in [facility] Cardiac and Pulmonary Rehab  Date 04/28/21  Educator AS  Instruction Review Code 1- Verbalizes Understanding        Education: Exercise & Equipment Safety: - Individual verbal  instruction and demonstration of equipment use and safety with use of the equipment. Flowsheet Row Cardiac Rehab from 04/28/2021 in [facility] Cardiac and Pulmonary Rehab  Date 04/01/21  Educator AS  Instruction Review Code 5- Refused Teaching       Education: Exercise Physiology & General Exercise Guidelines: - Group verbal and written instruction with models to review the exercise physiology of the cardiovascular system and associated critical values. Provides general exercise guidelines with specific guidelines to those with heart or lung disease.  Flowsheet Row Cardiac Rehab from 04/28/2021 in [facility] Cardiac and Pulmonary Rehab  Date 04/14/21  Educator AS  Instruction Review Code 1- Verbalizes Understanding       Education: Flexibility, Balance, Mind/Body Relaxation: - Group verbal and visual presentation with interactive activity on the components of exercise prescription. Introduces F.I.T.T principle from ACSM for exercise prescriptions. Reviews F.I.T.T. principles of flexibility and balance exercise training including progression. Also discusses the mind body connection.  Reviews various relaxation techniques to help reduce and manage stress (i.e. Deep breathing, progressive muscle relaxation, and visualization). Balance handout provided to take home. Written material given at graduation.   Activity Barriers & Risk Stratification:  Activity Barriers & Cardiac Risk Stratification - 03/17/21 1407       Activity Barriers & Cardiac Risk Stratification   Activity Barriers None    Cardiac Risk Stratification Moderate             6 Minute Walk:  6 Minute Walk     Row Name 04/01/21 1505         6 Minute Walk   Phase Initial     Distance 1375 feet     Walk Time 6 minutes     # of Rest Breaks 0     MPH 2.6     METS 4.53     RPE 7     Perceived Dyspnea  0     VO2 Peak 15.86     Symptoms No     Resting HR 82 bpm     Resting BP 90/56     Resting Oxygen Saturation  97 %      Exercise Oxygen Saturation  during 6 min walk 98 %     Max Ex. HR 112 bpm     Max Ex. BP 114/60     2 Minute Post BP 90/56              Oxygen Initial Assessment:   Oxygen Re-Evaluation:   Oxygen Discharge (Final Oxygen Re-Evaluation):   Initial Exercise Prescription:  Initial Exercise Prescription - 04/01/21 1500       Date of Initial Exercise RX and Referring Provider   Date 04/01/21    Referring Provider Arida      Treadmill   MPH 2.6    Grade 2    Minutes 15    METs 3.7      Recumbant Bike   Level 3    RPM 60  Watts 45    Minutes 15    METs 3.7      NuStep   Level 3    SPM 80    Minutes 15    METs 3.7      Elliptical   Level 1    Speed 2.5    Minutes 15    METs 3.7      REL-XR   Level 3    Speed 50    Minutes 15    METs 3.7      T5 Nustep   Level 2    SPM 80    Minutes 15    METs 3.7      Prescription Details   Frequency (times per week) 3    Duration Progress to 30 minutes of continuous aerobic without signs/symptoms of physical distress      Intensity   THRR 40-80% of Max Heartrate 121-160    Ratings of Perceived Exertion 11-15    Perceived Dyspnea 0-4      Resistance Training   Training Prescription Yes    Weight 3 lb    Reps 10-15             Perform Capillary Blood Glucose checks as needed.  Exercise Prescription Changes:   Exercise Prescription Changes     Row Name 04/01/21 1500 04/07/21 1300 04/21/21 1200 05/04/21 0800 05/17/21 1200     Response to Exercise   Blood Pressure (Admit) 90/56 96/64 104/60 90/56 90/58    Blood Pressure (Exercise) 114/60 122/68 128/68 112/58 108/62   Blood Pressure (Exit) 90/56 102/60 112/64 104/62 104/60   Heart Rate (Admit) 82 bpm 84 bpm 85 bpm 65 bpm 78 bpm   Heart Rate (Exercise) 112 bpm 122 bpm 152 bpm 117 bpm 133 bpm   Heart Rate (Exit) 86 bpm 103 bpm 106 bpm 101 bpm 103 bpm   Oxygen Saturation (Admit) 97 % -- -- -- --   Oxygen Saturation (Exercise) 98 % -- -- -- --    Rating of Perceived Exertion (Exercise) 7 12 15 13 13    Perceived Dyspnea (Exercise) 0 -- -- -- --   Symptoms none none none none none   Comments -- first day -- -- --   Duration -- Progress to 30 minutes of  aerobic without signs/symptoms of physical distress Continue with 30 min of aerobic exercise without signs/symptoms of physical distress. Continue with 30 min of aerobic exercise without signs/symptoms of physical distress. Continue with 30 min of aerobic exercise without signs/symptoms of physical distress.   Intensity -- THRR unchanged THRR unchanged THRR unchanged THRR unchanged     Progression   Progression -- Continue to progress workloads to maintain intensity without signs/symptoms of physical distress. Continue to progress workloads to maintain intensity without signs/symptoms of physical distress. Continue to progress workloads to maintain intensity without signs/symptoms of physical distress. Continue to progress workloads to maintain intensity without signs/symptoms of physical distress.   Average METs -- 3.86 3.9 3.25 4.05     Resistance Training   Training Prescription -- Yes Yes Yes Yes   Weight -- 3 lb 3 lb 5 lb 5 lb   Reps -- 10-15 10-15 10-15 10-15     Interval Training   Interval Training -- -- No No No     Treadmill   MPH -- -- 2.6 2.4 2.5   Grade -- -- 2 2 3.5   Minutes -- -- 15 15 15    METs -- -- 3.71 3.5 4.12  NuStep   Level -- -- 3 4 4    Minutes -- -- 15 15 15    METs -- -- 3 3 3      Elliptical   Level -- -- 1 -- --   Speed -- -- 2.6 -- --   Minutes -- -- 15 -- --     REL-XR   Level -- 3 3 -- 4   Minutes -- 15 15 -- 15   METs -- 4.6 5 -- 5     Track   Laps -- 39 -- -- --   Minutes -- 15 -- -- --   METs -- 3.12 -- -- --     Oxygen   Maintain Oxygen Saturation -- -- 88% or higher 88% or higher --            Exercise Comments:   Exercise Goals and Review:   Exercise Goals     Row Name 04/01/21 1516             Exercise  Goals   Increase Physical Activity Yes       Intervention Provide advice, education, support and counseling about physical activity/exercise needs.;Develop an individualized exercise prescription for aerobic and resistive training based on initial evaluation findings, risk stratification, comorbidities and participant's personal goals.       Expected Outcomes Short Term: Attend rehab on a regular basis to increase amount of physical activity.;Long Term: Add in home exercise to make exercise part of routine and to increase amount of physical activity.;Long Term: Exercising regularly at least 3-5 days a week.       Increase Strength and Stamina Yes       Intervention Provide advice, education, support and counseling about physical activity/exercise needs.;Develop an individualized exercise prescription for aerobic and resistive training based on initial evaluation findings, risk stratification, comorbidities and participant's personal goals.       Expected Outcomes Short Term: Increase workloads from initial exercise prescription for resistance, speed, and METs.;Short Term: Perform resistance training exercises routinely during rehab and add in resistance training at home;Long Term: Improve cardiorespiratory fitness, muscular endurance and strength as measured by increased METs and functional capacity (6MWT)       Able to understand and use rate of perceived exertion (RPE) scale Yes       Intervention Provide education and explanation on how to use RPE scale       Expected Outcomes Short Term: Able to use RPE daily in rehab to express subjective intensity level;Long Term:  Able to use RPE to guide intensity level when exercising independently       Able to understand and use Dyspnea scale Yes       Intervention Provide education and explanation on how to use Dyspnea scale       Expected Outcomes Short Term: Able to use Dyspnea scale daily in rehab to express subjective sense of shortness of breath during  exertion;Long Term: Able to use Dyspnea scale to guide intensity level when exercising independently       Knowledge and understanding of Target Heart Rate Range (THRR) Yes       Intervention Provide education and explanation of THRR including how the numbers were predicted and where they are located for reference       Expected Outcomes Short Term: Able to state/look up THRR;Short Term: Able to use daily as guideline for intensity in rehab;Long Term: Able to use THRR to govern intensity when exercising independently  Able to check pulse independently Yes       Intervention Provide education and demonstration on how to check pulse in carotid and radial arteries.;Review the importance of being able to check your own pulse for safety during independent exercise       Expected Outcomes Short Term: Able to explain why pulse checking is important during independent exercise;Long Term: Able to check pulse independently and accurately       Understanding of Exercise Prescription Yes       Intervention Provide education, explanation, and written materials on patient's individual exercise prescription       Expected Outcomes Short Term: Able to explain program exercise prescription;Long Term: Able to explain home exercise prescription to exercise independently                Exercise Goals Re-Evaluation :  Exercise Goals Re-Evaluation     Row Name 04/05/21 1714 04/21/21 1249 05/03/21 1733 05/04/21 0858 05/17/21 1259     Exercise Goal Re-Evaluation   Exercise Goals Review Increase Physical Activity;Able to understand and use rate of perceived exertion (RPE) scale;Knowledge and understanding of Target Heart Rate Range (THRR);Understanding of Exercise Prescription;Increase Strength and Stamina;Able to check pulse independently Increase Physical Activity;Increase Strength and Stamina;Understanding of Exercise Prescription Increase Physical Activity;Increase Strength and Stamina Increase Physical  Activity;Increase Strength and Stamina Increase Physical Activity;Increase Strength and Stamina   Comments Reviewed RPE and dyspnea scales, THR and program prescription with pt today.  Pt voiced understanding and was given a copy of goals to take home. Jolyssa is off to a good start in rehab.  She is already on the elliptical and up to 2.6 mph on the treadmill.  We will continue to monitor her progress. Tiernan is going to workout at a friends house when she is done with the program. She will go over home exercise with the EP when she has done more sessions. Explained to her that when it seems safer for her to exercise outside of the program she will add days outside of rehab to workout. Glenyce attends consistently and works in [employer] range most sessions.  She has increased to 5 lb for strength training.  We will continue to monitor progress. Brett is doing well in rehab. She has increased her level on the XR to 4 and has hit 4.12 METS on the treadmill which is the highest it has been thus far! Will continue to monitor.   Expected Outcomes Short: Use RPE daily to regulate intensity. Long: Follow program prescription in THR. Short: Continue to attend rehab regularly Long: Conitnue to follow program prescription. Short: meet with EP for Home exercise. Long: maintain workout regimine independently. Short: maintain conisstent attendance Long: continue to build stamina Short: Continue building up tolerance on TM Long: Continue to increase overall MET level            Discharge Exercise Prescription (Final Exercise Prescription Changes):  Exercise Prescription Changes - 05/17/21 1200       Response to Exercise   Blood Pressure (Admit) 90/58    Blood Pressure (Exercise) 108/62    Blood Pressure (Exit) 104/60    Heart Rate (Admit) 78 bpm    Heart Rate (Exercise) 133 bpm    Heart Rate (Exit) 103 bpm    Rating of Perceived Exertion (Exercise) 13    Symptoms none    Duration Continue with 30 min of aerobic  exercise without signs/symptoms of physical distress.    Intensity THRR unchanged  Progression   Progression Continue to progress workloads to maintain intensity without signs/symptoms of physical distress.    Average METs 4.05      Resistance Training   Training Prescription Yes    Weight 5 lb    Reps 10-15      Interval Training   Interval Training No      Treadmill   MPH 2.5    Grade 3.5    Minutes 15    METs 4.12      NuStep   Level 4    Minutes 15    METs 3      REL-XR   Level 4    Minutes 15    METs 5             Nutrition:  Target Goals: Understanding of nutrition guidelines, daily intake of sodium <1556m, cholesterol <2063m calories 30% from fat and 7% or less from saturated fats, daily to have 5 or more servings of fruits and vegetables.  Education: All About Nutrition: -Group instruction provided by verbal, written material, interactive activities, discussions, models, and posters to present general guidelines for heart healthy nutrition including fat, fiber, MyPlate, the role of sodium in heart healthy nutrition, utilization of the nutrition label, and utilization of this knowledge for meal planning. Follow up email sent as well. Written material given at graduation. [facility]  Education need identified 04/01/21       Biometrics:  Pre Biometrics - 04/01/21 1517       Pre Biometrics   Height 5' 5"  (1.651 m)    Weight 173 lb 1.6 oz (78.5 kg)    BMI (Calculated) 28.81    Single Leg Stand 30 seconds              Nutrition Therapy Plan and Nutrition Goals:  Nutrition Therapy & Goals - 04/14/21 1637       Nutrition Therapy   Diet Heart healthy, low Na    Drug/Food Interactions Statins/Certain Fruits    Protein (specify units) 60g    Fiber 30 grams    Whole Grain Foods 3 servings    Saturated Fats 12 max. grams    Fruits and Vegetables 8 servings/day    Sodium 1.5  grams      Personal Nutrition Goals   Nutrition Goal ST: 2-3 small frequent meals/snacks to make sure she is eating during the day - fruit with peanut butter, boiled eggs, wrap, vegetables with hummus, etc. LT: eat during the day, limit saturated fat <12g/day, limit Na <1.5/day, include 8 fruits/vegetables per day    Comments [id number]. F admitted to rehab for STEMI also presenting with CAD, HLD, GERD. Past surgical hx includes cholecystectomy. Relevant medications include lipitor, hydrocodone. She smokes 4-5 cigarettes per day. [name] reports that she is getting food out more than usual due to her childrens sports schedule/traveling. Discussed better options to get when going out to eat, grilled foods, vegetables, lean proteins, no cream sauces, no fried foods. She reports eating vegetable plates and nothing during the day due to being busy at work. Discussed small frequent meals/snacks to make sure she is eating during the day - fruit with peanut butter, boiled eggs, wrap, vegetables with hummus, etc. Dsicussed heart healthy eating.             Nutrition Assessments:  MEDIFICTS Score Key: ?70 Need to make dietary changes  40-70 Heart Healthy Diet ? 40 Therapeutic Level  Cholesterol Diet  Flowsheet Row Cardiac Rehab from 04/01/2021 in [facility] Cardiac and Pulmonary Rehab  Picture Your Plate Total Score on Admission 44      Picture Your Plate Scores: <46 Unhealthy dietary pattern with much room for improvement. 41-50 Dietary pattern unlikely to meet recommendations for good health and room for improvement. 51-60 More healthful dietary pattern, with some room for improvement.  >60 Healthy dietary pattern, although there may be some specific behaviors that could be improved.    Nutrition Goals Re-Evaluation:  Nutrition Goals Re-Evaluation     [place] Name 05/03/21 1743             Goals   Current Weight 168 lb (76.2 kg)       Nutrition Goal [place] more snacks throughout the day.        Comment Jearlene feels like she can go all day without eating and feel fine. Informed her about incorperating snacks throughout the day. She likes to have some canned soups because she gets home late. She likes vegatables and is going to try to [employer] for dinners.       Expected Outcome Short: eat more snacks throughout the day. Long: adhere to a heart healthy diet that pertains to her.                Nutrition Goals Discharge (Final Nutrition Goals Re-Evaluation):  Nutrition Goals Re-Evaluation - 05/03/21 1743       Goals   Current Weight 168 lb (76.2 kg)    Nutrition Goal [place] more snacks throughout the day.    Comment Adlene feels like she can go all day without eating and feel fine. Informed her about incorperating snacks throughout the day. She likes to have some canned soups because she gets home late. She likes vegatables and is going to try to [employer] for dinners.    Expected Outcome Short: eat more snacks throughout the day. Long: adhere to a heart healthy diet that pertains to her.             Psychosocial: Target Goals: Acknowledge presence or absence of significant depression and/or stress, maximize coping skills, provide positive support system. Participant is able to verbalize types and ability to use techniques and skills needed for reducing stress and depression.   Education: Stress, Anxiety, and Depression - Group verbal and visual presentation to define topics covered.  Reviews how body is impacted by stress, anxiety, and depression.  Also discusses healthy ways to reduce stress and to treat/manage anxiety and depression.  Written material given at graduation. Flowsheet Row Cardiac Rehab from 04/28/2021 in [facility] Cardiac and Pulmonary Rehab  Date 04/07/21  Educator [facility]  Instruction Review Code 1- Verbalizes Understanding       Education: Sleep Hygiene -Provides group verbal and written instruction about how sleep can affect your health.  Define sleep hygiene,  discuss sleep cycles and impact of sleep habits. Review good sleep hygiene tips.    Initial Review & Psychosocial Screening:  Initial Psych Review & Screening - 03/17/21 1412       Initial Review   Current issues with Current Stress Concerns      Family Dynamics   Good Support System? Yes   husband     Barriers   Psychosocial barriers to participate in program There are no identifiable barriers or psychosocial needs.;The patient should benefit from training in stress management and relaxation.      Screening Interventions   Interventions Encouraged to exercise;Provide feedback about the  scores to participant;To provide support and resources with identified psychosocial needs    Expected Outcomes Short Term goal: Utilizing psychosocial counselor, staff and physician to assist with identification of specific Stressors or current issues interfering with healing process. Setting desired goal for each stressor or current issue identified.;Long Term Goal: Stressors or current issues are controlled or eliminated.;Short Term goal: Identification and review with participant of any Quality of Life or Depression concerns found by scoring the questionnaire.;Long Term goal: The participant improves quality of Life and PHQ9 Scores as seen by post scores and/or verbalization of changes             Quality of Life Scores:   Quality of Life - 04/01/21 1518       Quality of Life   Select Quality of Life      Quality of Life Scores   Health/Function Pre 24.07 %    Socioeconomic Pre 20.69 %    Psych/Spiritual Pre 25.64 %    Family Pre 30 %    GLOBAL Pre 24.46 %            Scores of 19 and below usually indicate a poorer quality of life in these areas.  A difference of  2-3 points is a clinically meaningful difference.  A difference of 2-3 points in the total score of the Quality of Life Index has been associated with significant improvement in overall quality of life, self-image, physical  symptoms, and general health in studies assessing change in quality of life.  PHQ-9: Recent Review Flowsheet Data     Depression screen [facility] 2/9 04/01/2021   Decreased Interest 0   Down, Depressed, Hopeless 1   PHQ - 2 Score 1   Altered sleeping 0   Tired, decreased energy 1   Change in appetite 1   Feeling bad or failure about yourself  0   Trouble concentrating 0   Moving slowly or fidgety/restless 0   Suicidal thoughts 0   PHQ-9 Score 3   Difficult doing work/chores Not difficult at all      Interpretation of Total Score  Total Score Depression Severity:  1-4 = Minimal depression, 5-9 = Mild depression, 10-14 = Moderate depression, 15-19 = Moderately severe depression, 20-27 = Severe depression   Psychosocial Evaluation and Intervention:  Psychosocial Evaluation - 03/17/21 1426       Psychosocial Evaluation & Interventions   Interventions Encouraged to exercise with the program and follow exercise prescription    Comments Kippy is coming to cardiac rehab post STEMI w/ stent. She reports feeling well and is planning on returning back to work Monday after Thanksgiving. She has a great support system at home. She stated her biggest stressor is trying to figure out all she needs to do and change after this heart attack, so she is ready to get started in the program. Her daughter keeps her busy with travel ball and she typically works 6a-4p so eating heatlhy can be a struggle.    Expected Outcomes Short: attend cardiac rehab for education and exercise. Long: develop and maintain positive self care habits.    Continue Psychosocial Services  Follow up required by staff             Psychosocial Re-Evaluation:  Psychosocial Re-Evaluation     [place] Name 05/03/21 1737             Psychosocial Re-Evaluation   Current issues with Current Anxiety/Panic       Comments Avaree states that  she may have some anxiety but is going to see her primary tomorrow for treatment. She is to  inform staff if she is prescribed medication. Her doctor thinks it may be anxiety causing some palpitations.       Expected Outcomes Short: inform staff of medication and mood changes. Long: maintain a healthy stress free environment.       Interventions Encouraged to attend Cardiac Rehabilitation for the exercise;Stress management education;Relaxation education       Continue Psychosocial Services  Follow up required by staff                Psychosocial Discharge (Final Psychosocial Re-Evaluation):  Psychosocial Re-Evaluation - 05/03/21 1737       Psychosocial Re-Evaluation   Current issues with Current Anxiety/Panic    Comments Maridel states that she may have some anxiety but is going to see her primary tomorrow for treatment. She is to inform staff if she is prescribed medication. Her doctor thinks it may be anxiety causing some palpitations.    Expected Outcomes Short: inform staff of medication and mood changes. Long: maintain a healthy stress free environment.    Interventions Encouraged to attend Cardiac Rehabilitation for the exercise;Stress management education;Relaxation education    Continue Psychosocial Services  Follow up required by staff             Vocational Rehabilitation: Provide vocational rehab assistance to qualifying candidates.   Vocational Rehab Evaluation & Intervention:  Vocational Rehab - 03/17/21 1412       Initial Vocational Rehab Evaluation & Intervention   Assessment shows need for Vocational Rehabilitation No             Education: Education Goals: Education classes will be provided on a variety of topics geared toward better understanding of heart health and risk factor modification. Participant will state understanding/return demonstration of topics presented as noted by education test scores.  Learning Barriers/Preferences:  Learning Barriers/Preferences - 03/17/21 1411       Learning Barriers/Preferences   Learning Barriers None     Learning Preferences None             General Cardiac Education Topics:  AED/CPR: - Group verbal and written instruction with the use of models to demonstrate the basic use of the AED with the basic ABC's of resuscitation.   Anatomy and Cardiac Procedures: - Group verbal and visual presentation and models provide information about basic cardiac anatomy and function. Reviews the testing methods done to diagnose heart disease and the outcomes of the test results. Describes the treatment choices: Medical Management, Angioplasty, or Coronary Bypass Surgery for treating various heart conditions including Myocardial Infarction, Angina, Valve Disease, and Cardiac Arrhythmias.  Written material given at graduation. Flowsheet Row Cardiac Rehab from 04/28/2021 in [facility] Cardiac and Pulmonary Rehab  Date 04/28/21  Educator [facility]  Instruction Review Code 1- Verbalizes Understanding       Medication Safety: - Group verbal and visual instruction to review commonly prescribed medications for heart and lung disease. Reviews the medication, class of the drug, and side effects. Includes the steps to properly store meds and maintain the prescription regimen.  Written material given at graduation.   Intimacy: - Group verbal instruction through game format to discuss how heart and lung disease can affect sexual intimacy. Written material given at graduation..   Know Your Numbers and Heart Failure: - Group verbal and visual instruction to discuss disease risk factors for cardiac and pulmonary disease and treatment options.  Reviews  associated critical values for Overweight/Obesity, Hypertension, Cholesterol, and Diabetes.  Discusses basics of heart failure: signs/symptoms and treatments.  Introduces Heart Failure Zone chart for action plan for heart failure.  Written material given at graduation.   Infection Prevention: - Provides verbal and written material to individual with discussion of infection  control including proper hand washing and proper equipment cleaning during exercise session. Flowsheet Row Cardiac Rehab from 04/28/2021 in [facility] Cardiac and Pulmonary Rehab  Date 04/01/21  Educator AS  Instruction Review Code 1- Verbalizes Understanding       Falls Prevention: - Provides verbal and written material to individual with discussion of falls prevention and safety. Flowsheet Row Cardiac Rehab from 04/28/2021 in [facility] Cardiac and Pulmonary Rehab  Date 04/01/21  Educator AS  Instruction Review Code 1- Verbalizes Understanding       Other: -Provides group and verbal instruction on various topics (see comments)   Knowledge Questionnaire Score:  Knowledge Questionnaire Score - 04/01/21 1519       Knowledge Questionnaire Score   Pre Score 25/26             Core Components/Risk Factors/Patient Goals at Admission:  Personal Goals and Risk Factors at Admission - 04/01/21 1517       Core Components/Risk Factors/Patient Goals on Admission   Tobacco Cessation Yes    Intervention Assist the participant in steps to quit. Provide individualized education and counseling about committing to Tobacco Cessation, relapse prevention, and pharmacological support that can be provided by physician.;Advice worker, assist with locating and accessing local/national Quit Smoking programs, and support quit date choice.   gave [employer] packet   Expected Outcomes Short Term: Will demonstrate readiness to quit, by selecting a quit date.;Short Term: Will quit all tobacco product use, adhering to prevention of relapse plan.;Long Term: Complete abstinence from all tobacco products for at least 12 months from quit date.    Lipids Yes    Intervention Provide education and support for participant on nutrition & aerobic/resistive exercise along with prescribed medications to achieve LDL <28m, HDL >479m    Expected Outcomes Short Term: Participant states understanding of desired  cholesterol values and is compliant with medications prescribed. Participant is following exercise prescription and nutrition guidelines.;Long Term: Cholesterol controlled with medications as prescribed, with individualized exercise RX and with personalized nutrition plan. Value goals: LDL < 702mHDL > 40 mg.             Education:Diabetes - Individual verbal and written instruction to review signs/symptoms of diabetes, desired ranges of glucose level fasting, after meals and with exercise. Acknowledge that pre and post exercise glucose checks will be done for 3 sessions at entry of program.   Core Components/Risk Factors/Patient Goals Review:   Goals and Risk Factor Review     Row Name 05/03/21 1753             Core Components/Risk Factors/Patient Goals Review   Personal Goals Review Weight Management/Obesity       Review AngSharunda going to work on weight loss and eating snacks throughout the day. She would like to reach a lower weight. Sometimes she will go all day without eating due to her job.       Expected Outcomes Short: lose a few pounds in the next couple weeks. Long: reach weight goal.                Core Components/Risk Factors/Patient Goals at Discharge (Final Review):   Goals and Risk  Factor Review - 05/03/21 1753       Core Components/Risk Factors/Patient Goals Review   Personal Goals Review Weight Management/Obesity    Review Alexza is going to work on weight loss and eating snacks throughout the day. She would like to reach a lower weight. Sometimes she will go all day without eating due to her job.    Expected Outcomes Short: lose a few pounds in the next couple weeks. Long: reach weight goal.             ITP Comments:  ITP Comments     Row Name 03/17/21 1424 04/01/21 1531 04/05/21 1714 04/14/21 1651 04/21/21 0647   ITP Comments Initial telephone orientation completed. Diagnosis can be found in [facility] 11/12. EP orientation scheduled for Thursday 12/8  at 2pm. Completed 6MWT and gym orientation. Initial ITP created and sent for review to Dr. Emily Filbert, Medical Director. First full day of exercise!  Patient was oriented to gym and equipment including functions, settings, policies, and procedures.  Patient's individual exercise prescription and treatment plan were reviewed.  All starting workloads were established based on the results of the 6 minute walk test done at initial orientation visit.  The plan for exercise progression was also introduced and progression will be customized based on patient's performance and goals. Completed initial RD consultation 30 Day review completed. Medical Director ITP review done, changes made as directed, and signed approval by Medical Director.    [place] Name 05/19/21 0822           ITP Comments 30 Day review completed. Medical Director ITP review done, changes made as directed, and signed approval by Medical Director.                Comments:

## 2021-05-19 NOTE — Progress Notes (Signed)
Daily Session Note  Patient Details  Name: Morgan Mcdowell MRN: [id number] Date of Birth: March 30, 1981 Referring Provider:   Flowsheet Row Cardiac Rehab from 04/01/2021 in [facility] Cardiac and Pulmonary Rehab  Referring Provider Fletcher Anon       Encounter Date: 05/19/2021  Check In:  Session Check In - 05/19/21 1640       Check-In   Supervising physician immediately available to respond to emergencies See telemetry face sheet for immediately available ER MD    Location [facility]-Cardiac & Pulmonary Rehab    Staff Present Birdie Sons, MPA, RN;Joseph Alcus Dad, RN BSN    Virtual Visit No    Medication changes reported     No    Fall or balance concerns reported    No    Tobacco Cessation No Change    Warm-up and Cool-down Performed on first and last piece of equipment    Resistance Training Performed Yes    VAD Patient? No    PAD/SET Patient? No      Pain Assessment   Currently in Pain? No/denies                Social History   Tobacco Use  Smoking Status Every Day   Packs/day: 1.00   Years: 15.00   Pack years: 15.00   Types: Cigarettes   Last attempt to quit: 03/04/2021   Years since quitting: 0.2  Smokeless Tobacco Never  Tobacco Comments   04/09/21 5 cigarettes per day    Goals Met:  Independence with exercise equipment Exercise tolerated well No report of concerns or symptoms today Strength training completed today  Goals Unmet:  Not Applicable  Comments: Pt able to follow exercise prescription today without complaint.  Will continue to monitor for progression.    Dr. Emily Filbert is Medical Director for [place].  Dr. Ottie Glazier is Medical Director for [facility] Pulmonary Rehabilitation.

## 2021-05-24 ENCOUNTER — Other Ambulatory Visit: Payer: Self-pay

## 2021-05-24 ENCOUNTER — Encounter: Payer: BC Managed Care – PPO | Admitting: *Deleted

## 2021-05-24 DIAGNOSIS — Z48812 Encounter for surgical aftercare following surgery on the circulatory system: Secondary | ICD-10-CM | POA: Diagnosis not present

## 2021-05-24 DIAGNOSIS — Z955 Presence of coronary angioplasty implant and graft: Secondary | ICD-10-CM

## 2021-05-24 DIAGNOSIS — I213 ST elevation (STEMI) myocardial infarction of unspecified site: Secondary | ICD-10-CM

## 2021-05-24 NOTE — Progress Notes (Signed)
Daily Session Note  Patient Details  Name: Morgan Mcdowell MRN: [id number] Date of Birth: 07/25/1980 Referring Provider:   Flowsheet Row Cardiac Rehab from 04/01/2021 in [facility] Cardiac and Pulmonary Rehab  Referring Provider Fletcher Anon       Encounter Date: 05/24/2021  Check In:  Session Check In - 05/24/21 1712       Check-In   Supervising physician immediately available to respond to emergencies See telemetry face sheet for immediately available ER MD    Location [facility]-Cardiac & Pulmonary Rehab    Staff Present Renita Papa, RN BSN;Joseph [place], RCP,RRT,BSRT;Kara [place], [place], ASCM CEP, Exercise Physiologist    Virtual Visit No    Medication changes reported     No    Fall or balance concerns reported    No    Tobacco Cessation No Change    Current number of cigarettes/nicotine per day     5    Warm-up and Cool-down Performed on first and last piece of equipment    Resistance Training Performed Yes    VAD Patient? No    PAD/SET Patient? No      Pain Assessment   Currently in Pain? No/denies                Social History   Tobacco Use  Smoking Status Every Day   Packs/day: 1.00   Years: 15.00   Pack years: 15.00   Types: Cigarettes   Last attempt to quit: 03/04/2021   Years since quitting: 0.2  Smokeless Tobacco Never  Tobacco Comments   04/09/21 5 cigarettes per day    Goals Met:  Independence with exercise equipment Exercise tolerated well No report of concerns or symptoms today Strength training completed today  Goals Unmet:  Not Applicable  Comments: Pt able to follow exercise prescription today without complaint.  Will continue to monitor for progression.    Dr. Emily Filbert is Medical Director for [place].  Dr. Ottie Glazier is Medical Director for [facility] Pulmonary Rehabilitation.

## 2021-05-27 ENCOUNTER — Other Ambulatory Visit: Payer: Self-pay

## 2021-05-27 ENCOUNTER — Encounter: Payer: BC Managed Care – PPO | Attending: Cardiovascular Disease | Admitting: *Deleted

## 2021-05-27 DIAGNOSIS — I213 ST elevation (STEMI) myocardial infarction of unspecified site: Secondary | ICD-10-CM | POA: Diagnosis not present

## 2021-05-27 DIAGNOSIS — Z955 Presence of coronary angioplasty implant and graft: Secondary | ICD-10-CM | POA: Diagnosis present

## 2021-05-27 NOTE — Progress Notes (Signed)
Daily Session Note  Patient Details  Name: Morgan Mcdowell MRN: [id number] Date of Birth: [birth year] Referring Provider:   Flowsheet Row Cardiac Rehab from 04/01/2021 in [facility] Cardiac and Pulmonary Rehab  Referring Provider Arida       Encounter Date: 05/27/2021  Check In:  Session Check In - 05/27/21 1731       Check-In   Supervising physician immediately available to respond to emergencies See telemetry face sheet for immediately available ER MD    Location [facility]-Cardiac & Pulmonary Rehab    Staff Present Renita Papa, RN BSN;Joseph No Name, RCP,RRT,BSRT;Amanda [place], [place], ACSM CEP, Exercise Physiologist    Virtual Visit No    Medication changes reported     No    Fall or balance concerns reported    No    Tobacco Cessation No Change    Current number of cigarettes/nicotine per day     5    Warm-up and Cool-down Performed on first and last piece of equipment    Resistance Training Performed Yes    VAD Patient? No    PAD/SET Patient? No      Pain Assessment   Currently in Pain? No/denies                Social History   Tobacco Use  Smoking Status Every Day   Packs/day: 1.00   Years: 15.00   Pack years: 15.00   Types: Cigarettes   Last attempt to quit: 03/04/2021   Years since quitting: 0.2  Smokeless Tobacco Never  Tobacco Comments   04/09/21 5 cigarettes per day    Goals Met:  Independence with exercise equipment Exercise tolerated well No report of concerns or symptoms today Strength training completed today  Goals Unmet:  Not Applicable  Comments: Pt able to follow exercise prescription today without complaint.  Will continue to monitor for progression.    Dr. Emily Filbert is Medical Director for [place].  Dr. Ottie Glazier is Medical Director for [facility] Pulmonary Rehabilitation.

## 2021-05-31 ENCOUNTER — Other Ambulatory Visit: Payer: Self-pay

## 2021-05-31 ENCOUNTER — Encounter: Payer: BC Managed Care – PPO | Admitting: *Deleted

## 2021-05-31 DIAGNOSIS — I213 ST elevation (STEMI) myocardial infarction of unspecified site: Secondary | ICD-10-CM

## 2021-05-31 DIAGNOSIS — Z955 Presence of coronary angioplasty implant and graft: Secondary | ICD-10-CM

## 2021-05-31 NOTE — Progress Notes (Signed)
Daily Session Note  Patient Details  Name: Morgan Mcdowell MRN: [id number] Date of Birth: Sep 14, 1980 Referring Provider:   Flowsheet Row Cardiac Rehab from 04/01/2021 in [facility] Cardiac and Pulmonary Rehab  Referring Provider Arida       Encounter Date: 05/31/2021  Check In:  Session Check In - 05/31/21 1706       Check-In   Supervising physician immediately available to respond to emergencies See telemetry face sheet for immediately available ER MD    Location [facility]-Cardiac & Pulmonary Rehab    Staff Present Renita Papa, RN BSN;Joseph Tessie Fass, RCP,RRT,BSRT;Amanda [place], [place], ACSM CEP, Exercise Physiologist    Virtual Visit No    Medication changes reported     No    Current number of cigarettes/nicotine per day     5    Warm-up and Cool-down Performed on first and last piece of equipment    Resistance Training Performed Yes    VAD Patient? No    PAD/SET Patient? No      Pain Assessment   Currently in Pain? No/denies                Social History   Tobacco Use  Smoking Status Every Day   Packs/day: 1.00   Years: 15.00   Pack years: 15.00   Types: Cigarettes   Last attempt to quit: 03/04/2021   Years since quitting: 0.2  Smokeless Tobacco Never  Tobacco Comments   04/09/21 5 cigarettes per day    Goals Met:  Independence with exercise equipment Exercise tolerated well No report of concerns or symptoms today Strength training completed today  Goals Unmet:  Not Applicable  Comments: Pt able to follow exercise prescription today without complaint.  Will continue to monitor for progression.    Dr. Emily Filbert is Medical Director for [place].  Dr. Ottie Glazier is Medical Director for [facility] Pulmonary Rehabilitation.

## 2021-06-03 ENCOUNTER — Other Ambulatory Visit: Payer: Self-pay

## 2021-06-03 ENCOUNTER — Encounter: Payer: BC Managed Care – PPO | Admitting: *Deleted

## 2021-06-03 DIAGNOSIS — I213 ST elevation (STEMI) myocardial infarction of unspecified site: Secondary | ICD-10-CM

## 2021-06-03 DIAGNOSIS — Z955 Presence of coronary angioplasty implant and graft: Secondary | ICD-10-CM

## 2021-06-03 NOTE — Progress Notes (Signed)
Daily Session Note  Patient Details  Name: Morgan Mcdowell MRN: [id number] Date of Birth: Jan 30, 1981 Referring Provider:   Flowsheet Row Cardiac Rehab from 04/01/2021 in [facility] Cardiac and Pulmonary Rehab  Referring Provider Arida       Encounter Date: 06/03/2021  Check In:  Session Check In - 06/03/21 1718       Check-In   Supervising physician immediately available to respond to emergencies See telemetry face sheet for immediately available ER MD    Location [facility]-Cardiac & Pulmonary Rehab    Staff Present Renita Papa, RN BSN;Joseph [place], RCP,RRT,BSRT;Jessica [place], [place], RCEP, CCRP, CCET    Virtual Visit No    Medication changes reported     No    Fall or balance concerns reported    No    Tobacco Cessation No Change    Current number of cigarettes/nicotine per day     5    Warm-up and Cool-down Performed on first and last piece of equipment    Resistance Training Performed Yes    VAD Patient? No    PAD/SET Patient? No      Pain Assessment   Currently in Pain? No/denies                Social History   Tobacco Use  Smoking Status Every Day   Packs/day: 1.00   Years: 15.00   Pack years: 15.00   Types: Cigarettes   Last attempt to quit: 03/04/2021   Years since quitting: 0.2  Smokeless Tobacco Never  Tobacco Comments   04/09/21 5 cigarettes per day    Goals Met:  Independence with exercise equipment Exercise tolerated well No report of concerns or symptoms today Strength training completed today  Goals Unmet:  Not Applicable  Comments: Pt able to follow exercise prescription today without complaint.  Will continue to monitor for progression.    Dr. Emily Filbert is Medical Director for [place].  Dr. Ottie Glazier is Medical Director for [facility] Pulmonary Rehabilitation.

## 2021-06-14 ENCOUNTER — Other Ambulatory Visit: Payer: Self-pay

## 2021-06-14 ENCOUNTER — Other Ambulatory Visit (INDEPENDENT_AMBULATORY_CARE_PROVIDER_SITE_OTHER): Payer: BC Managed Care – PPO

## 2021-06-14 DIAGNOSIS — I2119 ST elevation (STEMI) myocardial infarction involving other coronary artery of inferior wall: Secondary | ICD-10-CM

## 2021-06-14 DIAGNOSIS — E785 Hyperlipidemia, unspecified: Secondary | ICD-10-CM

## 2021-06-14 DIAGNOSIS — I2510 Atherosclerotic heart disease of native coronary artery without angina pectoris: Secondary | ICD-10-CM | POA: Diagnosis not present

## 2021-06-14 DIAGNOSIS — I2583 Coronary atherosclerosis due to lipid rich plaque: Secondary | ICD-10-CM

## 2021-06-15 ENCOUNTER — Encounter: Payer: Self-pay | Admitting: *Deleted

## 2021-06-15 DIAGNOSIS — I213 ST elevation (STEMI) myocardial infarction of unspecified site: Secondary | ICD-10-CM

## 2021-06-15 DIAGNOSIS — Z955 Presence of coronary angioplasty implant and graft: Secondary | ICD-10-CM

## 2021-06-15 LAB — HEPATIC FUNCTION PANEL
ALT: 10 IU/L (ref 0–32)
AST: 9 IU/L (ref 0–40)
Albumin: 4.3 g/dL (ref 3.8–4.8)
Alkaline Phosphatase: 98 IU/L (ref 44–121)
Bilirubin Total: 0.2 mg/dL (ref 0.0–1.2)
Bilirubin, Direct: <0.1 mg/dL (ref 0.00–0.40)
Total Protein: 6.9 g/dL (ref 6.0–8.5)

## 2021-06-15 LAB — LIPID PANEL
Chol/HDL Ratio: 3.3 ratio (ref 0.0–4.4)
Cholesterol, Total: 117 mg/dL (ref 100–199)
HDL: 35 mg/dL — ABNORMAL LOW (ref >39)
LDL Chol Calc (NIH): 63 mg/dL (ref 0–99)
Triglycerides: 97 mg/dL (ref 0–149)
VLDL Cholesterol Cal: 19 mg/dL (ref 5–40)

## 2021-06-16 ENCOUNTER — Telehealth: Payer: Self-pay

## 2021-06-16 ENCOUNTER — Encounter: Payer: Self-pay | Admitting: *Deleted

## 2021-06-16 DIAGNOSIS — I213 ST elevation (STEMI) myocardial infarction of unspecified site: Secondary | ICD-10-CM

## 2021-06-16 DIAGNOSIS — Z955 Presence of coronary angioplasty implant and graft: Secondary | ICD-10-CM

## 2021-06-16 NOTE — Progress Notes (Signed)
Cardiac Individual Treatment Plan  Patient Details  Name: Morgan Mcdowell MRN: [id number] Date of Birth: Dec 03, 1980 Referring Provider:   Flowsheet Row Cardiac Rehab from 04/01/2021 in [facility] Cardiac and Pulmonary Rehab  Referring Provider Arida       Initial Encounter Date:  Flowsheet Row Cardiac Rehab from 04/01/2021 in [facility] Cardiac and Pulmonary Rehab  Date 04/01/21       Visit Diagnosis: ST elevation myocardial infarction (STEMI), unspecified artery [facility])  Status post coronary artery stent placement  Patient's Home Medications on Admission:  Current Outpatient Medications:    aspirin 81 MG chewable tablet, Chew 1 tablet (81 mg total) by mouth daily., Disp: 90 tablet, Rfl: 3   atorvastatin (LIPITOR) 80 MG tablet, Take 1 tablet (80 mg total) by mouth daily., Disp: 90 tablet, Rfl: 3   ezetimibe (ZETIA) 10 MG tablet, Take 1 tablet (10 mg total) by mouth daily., Disp: 90 tablet, Rfl: 3   metoprolol succinate (TOPROL-XL) 25 MG 24 hr tablet, Take 0.5 tablets (12.5 mg total) by mouth daily. Take with or immediately following a meal., Disp: 45 tablet, Rfl: 3   nitroGLYCERIN (NITROSTAT) 0.4 MG SL tablet, Place 1 tablet (0.4 mg total) under the tongue every 5 (five) minutes as needed for chest pain., Disp: 25 tablet, Rfl: 2   prasugrel (EFFIENT) 10 MG TABS tablet, Take 1 tablet (10 mg total) by mouth daily., Disp: 90 tablet, Rfl: 3   sertraline (ZOLOFT) 50 MG tablet, Take 50 mg by mouth daily., Disp: , Rfl:   Past Medical History: Past Medical History:  Diagnosis Date   CAD (coronary artery disease)    a. 02/2021 Inf STEMI/PCI: LM nl, LCX nl, OM1/2/3 nl, RCA 30p, RPAV 100 (2.5x12 Onyx Frontier DES), RPL1 nl, RPL2 100. EF 50-55%.   Ectopic pregnancy    GERD (gastroesophageal reflux disease)    OCC-TUMS PRN   History of echocardiogram    a. 02/2021 Echo: EF 60-65%, no nrwma, nl RV size/fxn, triv MR.   Hyperlipidemia LDL goal <70    Latent tuberculosis by blood test    Psoriasis     Tobacco abuse     Tobacco Use: Social History   Tobacco Use  Smoking Status Every Day   Packs/day: 1.00   Years: 15.00   Pack years: 15.00   Types: Cigarettes   Last attempt to quit: 03/04/2021   Years since quitting: 0.2  Smokeless Tobacco Never  Tobacco Comments   04/09/21 5 cigarettes per day    Labs: Recent Review Flowsheet Data     Labs for ITP Cardiac and Pulmonary Rehab Latest Ref Rng & Units 03/06/2021 04/09/2021 06/14/2021   Cholestrol 100 - 199 mg/dL 211(H) 154 117   LDLCALC 0 - 99 mg/dL 172(H) 93 63   HDL >39 mg/dL 22(L) 31(L) 35(L)   Trlycerides 0 - 149 mg/dL 84 174(H) 97   Hemoglobin A1c 4.8 - 5.6 % 6.2(H) - -        Exercise Target Goals: Exercise Program Goal: Individual exercise prescription set using results from initial 6 min walk test and THRR while considering  patients activity barriers and safety.   Exercise Prescription Goal: Initial exercise prescription builds to 30-45 minutes a day of aerobic activity, 2-3 days per week.  Home exercise guidelines will be given to patient during program as part of exercise prescription that the participant will acknowledge.   Education: Aerobic Exercise: - Group verbal and visual presentation on the components of exercise prescription. Introduces F.I.T.T principle from ACSM  for exercise prescriptions.  Reviews F.I.T.T. principles of aerobic exercise including progression. Written material given at graduation.   Education: Resistance Exercise: - Group verbal and visual presentation on the components of exercise prescription. Introduces F.I.T.T principle from ACSM for exercise prescriptions  Reviews F.I.T.T. principles of resistance exercise including progression. Written material given at graduation. Flowsheet Row Cardiac Rehab from 05/19/2021 in [facility] Cardiac and Pulmonary Rehab  Date 04/28/21  Educator AS  Instruction Review Code 1- Verbalizes Understanding        Education: Exercise & Equipment  Safety: - Individual verbal instruction and demonstration of equipment use and safety with use of the equipment. Flowsheet Row Cardiac Rehab from 05/19/2021 in [facility] Cardiac and Pulmonary Rehab  Date 04/01/21  Educator AS  Instruction Review Code 5- Refused Teaching       Education: Exercise Physiology & General Exercise Guidelines: - Group verbal and written instruction with models to review the exercise physiology of the cardiovascular system and associated critical values. Provides general exercise guidelines with specific guidelines to those with heart or lung disease.  Flowsheet Row Cardiac Rehab from 05/19/2021 in [facility] Cardiac and Pulmonary Rehab  Date 04/14/21  Educator AS  Instruction Review Code 1- Verbalizes Understanding       Education: Flexibility, Balance, Mind/Body Relaxation: - Group verbal and visual presentation with interactive activity on the components of exercise prescription. Introduces F.I.T.T principle from ACSM for exercise prescriptions. Reviews F.I.T.T. principles of flexibility and balance exercise training including progression. Also discusses the mind body connection.  Reviews various relaxation techniques to help reduce and manage stress (i.e. Deep breathing, progressive muscle relaxation, and visualization). Balance handout provided to take home. Written material given at graduation.   Activity Barriers & Risk Stratification:  Activity Barriers & Cardiac Risk Stratification - 03/17/21 1407       Activity Barriers & Cardiac Risk Stratification   Activity Barriers None    Cardiac Risk Stratification Moderate             6 Minute Walk:  6 Minute Walk     Row Name 04/01/21 1505         6 Minute Walk   Phase Initial     Distance 1375 feet     Walk Time 6 minutes     # of Rest Breaks 0     MPH 2.6     METS 4.53     RPE 7     Perceived Dyspnea  0     VO2 Peak 15.86     Symptoms No     Resting HR 82 bpm     Resting BP 90/56     Resting  Oxygen Saturation  97 %     Exercise Oxygen Saturation  during 6 min walk 98 %     Max Ex. HR 112 bpm     Max Ex. BP 114/60     2 Minute Post BP 90/56              Oxygen Initial Assessment:   Oxygen Re-Evaluation:   Oxygen Discharge (Final Oxygen Re-Evaluation):   Initial Exercise Prescription:  Initial Exercise Prescription - 04/01/21 1500       Date of Initial Exercise RX and Referring Provider   Date 04/01/21    Referring Provider Arida      Treadmill   MPH 2.6    Grade 2    Minutes 15    METs 3.7      Recumbant Bike   Level  3    RPM 60    Watts 45    Minutes 15    METs 3.7      NuStep   Level 3    SPM 80    Minutes 15    METs 3.7      Elliptical   Level 1    Speed 2.5    Minutes 15    METs 3.7      REL-XR   Level 3    Speed 50    Minutes 15    METs 3.7      T5 Nustep   Level 2    SPM 80    Minutes 15    METs 3.7      Prescription Details   Frequency (times per week) 3    Duration Progress to 30 minutes of continuous aerobic without signs/symptoms of physical distress      Intensity   THRR 40-80% of Max Heartrate 121-160    Ratings of Perceived Exertion 11-15    Perceived Dyspnea 0-4      Resistance Training   Training Prescription Yes    Weight 3 lb    Reps 10-15             Perform Capillary Blood Glucose checks as needed.  Exercise Prescription Changes:   Exercise Prescription Changes     Row Name 04/01/21 1500 04/07/21 1300 04/21/21 1200 05/04/21 0800 05/17/21 1200     Response to Exercise   Blood Pressure (Admit) 90/56 96/64 104/60 90/56 90/58    Blood Pressure (Exercise) 114/60 122/68 128/68 112/58 108/62   Blood Pressure (Exit) 90/56 102/60 112/64 104/62 104/60   Heart Rate (Admit) 82 bpm 84 bpm 85 bpm 65 bpm 78 bpm   Heart Rate (Exercise) 112 bpm 122 bpm 152 bpm 117 bpm 133 bpm   Heart Rate (Exit) 86 bpm 103 bpm 106 bpm 101 bpm 103 bpm   Oxygen Saturation (Admit) 97 % -- -- -- --   Oxygen Saturation  (Exercise) 98 % -- -- -- --   Rating of Perceived Exertion (Exercise) 7 12 15 13 13    Perceived Dyspnea (Exercise) 0 -- -- -- --   Symptoms none none none none none   Comments -- first day -- -- --   Duration -- Progress to 30 minutes of  aerobic without signs/symptoms of physical distress Continue with 30 min of aerobic exercise without signs/symptoms of physical distress. Continue with 30 min of aerobic exercise without signs/symptoms of physical distress. Continue with 30 min of aerobic exercise without signs/symptoms of physical distress.   Intensity -- THRR unchanged THRR unchanged THRR unchanged THRR unchanged     Progression   Progression -- Continue to progress workloads to maintain intensity without signs/symptoms of physical distress. Continue to progress workloads to maintain intensity without signs/symptoms of physical distress. Continue to progress workloads to maintain intensity without signs/symptoms of physical distress. Continue to progress workloads to maintain intensity without signs/symptoms of physical distress.   Average METs -- 3.86 3.9 3.25 4.05     Resistance Training   Training Prescription -- Yes Yes Yes Yes   Weight -- 3 lb 3 lb 5 lb 5 lb   Reps -- 10-15 10-15 10-15 10-15     Interval Training   Interval Training -- -- No No No     Treadmill   MPH -- -- 2.6 2.4 2.5   Grade -- -- 2 2 3.5   Minutes -- -- 15 15 15  METs -- -- 3.71 3.5 4.12     NuStep   Level -- -- 3 4 4    Minutes -- -- 15 15 15    METs -- -- 3 3 3      Elliptical   Level -- -- 1 -- --   Speed -- -- 2.6 -- --   Minutes -- -- 15 -- --     REL-XR   Level -- 3 3 -- 4   Minutes -- 15 15 -- 15   METs -- 4.6 5 -- 5     Track   Laps -- 39 -- -- --   Minutes -- 15 -- -- --   METs -- 3.12 -- -- --     Oxygen   Maintain Oxygen Saturation -- -- 88% or higher 88% or higher --    Row Name 06/01/21 0700             Response to Exercise   Blood Pressure (Admit) 116/64       Blood  Pressure (Exit) 100/64       Heart Rate (Admit) 60 bpm       Heart Rate (Exercise) 113 bpm       Heart Rate (Exit) 96 bpm       Rating of Perceived Exertion (Exercise) 12       Symptoms none       Duration Continue with 30 min of aerobic exercise without signs/symptoms of physical distress.       Intensity THRR unchanged         Progression   Progression Continue to progress workloads to maintain intensity without signs/symptoms of physical distress.       Average METs 3.5         Resistance Training   Training Prescription Yes       Weight 6 lb       Reps 10-15         Interval Training   Interval Training No         Treadmill   MPH 2.8       Grade 3.5       Minutes 15       METs 4.49         Track   Laps 36       Minutes 15       METs 2.96         Oxygen   Maintain Oxygen Saturation 88% or higher                Exercise Comments:   Exercise Goals and Review:   Exercise Goals     Row Name 04/01/21 1516             Exercise Goals   Increase Physical Activity Yes       Intervention Provide advice, education, support and counseling about physical activity/exercise needs.;Develop an individualized exercise prescription for aerobic and resistive training based on initial evaluation findings, risk stratification, comorbidities and participant's personal goals.       Expected Outcomes Short Term: Attend rehab on a regular basis to increase amount of physical activity.;Long Term: Add in home exercise to make exercise part of routine and to increase amount of physical activity.;Long Term: Exercising regularly at least 3-5 days a week.       Increase Strength and Stamina Yes       Intervention Provide advice, education, support and counseling about physical activity/exercise needs.;Develop an individualized exercise prescription for aerobic  and resistive training based on initial evaluation findings, risk stratification, comorbidities and participant's personal  goals.       Expected Outcomes Short Term: Increase workloads from initial exercise prescription for resistance, speed, and METs.;Short Term: Perform resistance training exercises routinely during rehab and add in resistance training at home;Long Term: Improve cardiorespiratory fitness, muscular endurance and strength as measured by increased METs and functional capacity (6MWT)       Able to understand and use rate of perceived exertion (RPE) scale Yes       Intervention Provide education and explanation on how to use RPE scale       Expected Outcomes Short Term: Able to use RPE daily in rehab to express subjective intensity level;Long Term:  Able to use RPE to guide intensity level when exercising independently       Able to understand and use Dyspnea scale Yes       Intervention Provide education and explanation on how to use Dyspnea scale       Expected Outcomes Short Term: Able to use Dyspnea scale daily in rehab to express subjective sense of shortness of breath during exertion;Long Term: Able to use Dyspnea scale to guide intensity level when exercising independently       Knowledge and understanding of Target Heart Rate Range (THRR) Yes       Intervention Provide education and explanation of THRR including how the numbers were predicted and where they are located for reference       Expected Outcomes Short Term: Able to state/look up THRR;Short Term: Able to use daily as guideline for intensity in rehab;Long Term: Able to use THRR to govern intensity when exercising independently       Able to check pulse independently Yes       Intervention Provide education and demonstration on how to check pulse in carotid and radial arteries.;Review the importance of being able to check your own pulse for safety during independent exercise       Expected Outcomes Short Term: Able to explain why pulse checking is important during independent exercise;Long Term: Able to check pulse independently and accurately        Understanding of Exercise Prescription Yes       Intervention Provide education, explanation, and written materials on patient's individual exercise prescription       Expected Outcomes Short Term: Able to explain program exercise prescription;Long Term: Able to explain home exercise prescription to exercise independently                Exercise Goals Re-Evaluation :  Exercise Goals Re-Evaluation     Row Name 04/05/21 1714 04/21/21 1249 05/03/21 1733 05/04/21 0858 05/17/21 1259     Exercise Goal Re-Evaluation   Exercise Goals Review Increase Physical Activity;Able to understand and use rate of perceived exertion (RPE) scale;Knowledge and understanding of Target Heart Rate Range (THRR);Understanding of Exercise Prescription;Increase Strength and Stamina;Able to check pulse independently Increase Physical Activity;Increase Strength and Stamina;Understanding of Exercise Prescription Increase Physical Activity;Increase Strength and Stamina Increase Physical Activity;Increase Strength and Stamina Increase Physical Activity;Increase Strength and Stamina   Comments Reviewed RPE and dyspnea scales, THR and program prescription with pt today.  Pt voiced understanding and was given a copy of goals to take home. Jerilee is off to a good start in rehab.  She is already on the elliptical and up to 2.6 mph on the treadmill.  We will continue to monitor her progress. Jerrilyn is going to workout at a  friends house when she is done with the program. She will go over home exercise with the EP when she has done more sessions. Explained to her that when it seems safer for her to exercise outside of the program she will add days outside of rehab to workout. Jodette attends consistently and works in [employer] range most sessions.  She has increased to 5 lb for strength training.  We will continue to monitor progress. Shahd is doing well in rehab. She has increased her level on the XR to 4 and has hit 4.12 METS on the  treadmill which is the highest it has been thus far! Will continue to monitor.   Expected Outcomes Short: Use RPE daily to regulate intensity. Long: Follow program prescription in THR. Short: Continue to attend rehab regularly Long: Conitnue to follow program prescription. Short: meet with EP for Home exercise. Long: maintain workout regimine independently. Short: maintain conisstent attendance Long: continue to build stamina Short: Continue building up tolerance on TM Long: Continue to increase overall MET level    Row Name 06/01/21 0752 06/15/21 1543           Exercise Goal Re-Evaluation   Exercise Goals Review Increase Physical Activity;Increase Strength and Stamina --      Comments Zenaida continues to do well and has reached 4.49 METs on TM!  She has done up to 45 laps on the track.  She has increased to 6 lb for strength training. [name] has not attended since 2/9 for various reasons, primarily work commitments      Expected Outcomes Short:  continue to attend consistently Long:  continue to build stamina --               Discharge Exercise Prescription (Final Exercise Prescription Changes):  Exercise Prescription Changes - 06/01/21 0700       Response to Exercise   Blood Pressure (Admit) 116/64    Blood Pressure (Exit) 100/64    Heart Rate (Admit) 60 bpm    Heart Rate (Exercise) 113 bpm    Heart Rate (Exit) 96 bpm    Rating of Perceived Exertion (Exercise) 12    Symptoms none    Duration Continue with 30 min of aerobic exercise without signs/symptoms of physical distress.    Intensity THRR unchanged      Progression   Progression Continue to progress workloads to maintain intensity without signs/symptoms of physical distress.    Average METs 3.5      Resistance Training   Training Prescription Yes    Weight 6 lb    Reps 10-15      Interval Training   Interval Training No      Treadmill   MPH 2.8    Grade 3.5    Minutes 15    METs 4.49      Track   Laps 36     Minutes 15    METs 2.96      Oxygen   Maintain Oxygen Saturation 88% or higher             Nutrition:  Target Goals: Understanding of nutrition guidelines, daily intake of sodium <1544m, cholesterol <2038m calories 30% from fat and 7% or less from saturated fats, daily to have 5 or more servings of fruits and vegetables.  Education: All About Nutrition: -Group instruction provided by verbal, written material, interactive activities, discussions, models, and posters to present general guidelines for heart healthy nutrition including fat, fiber, MyPlate, the role of sodium in heart  healthy nutrition, utilization of the nutrition label, and utilization of this knowledge for meal planning. Follow up email sent as well. Written material given at graduation. Flowsheet Row Cardiac Rehab from 05/19/2021 in [facility] Cardiac and Pulmonary Rehab  Education need identified 04/01/21  Date 05/19/21  Educator [place]  Instruction Review Code 1- Verbalizes Understanding       Biometrics:  Pre Biometrics - 04/01/21 1517       Pre Biometrics   Height 5' 5"  (1.651 m)    Weight 173 lb 1.6 oz (78.5 kg)    BMI (Calculated) 28.81    Single Leg Stand 30 seconds              Nutrition Therapy Plan and Nutrition Goals:  Nutrition Therapy & Goals - 04/14/21 1637       Nutrition Therapy   Diet Heart healthy, low Na    Drug/Food Interactions Statins/Certain Fruits    Protein (specify units) 60g    Fiber 30 grams    Whole Grain Foods 3 servings    Saturated Fats 12 max. grams    Fruits and Vegetables 8 servings/day    Sodium 1.5 grams      Personal Nutrition Goals   Nutrition Goal ST: 2-3 small frequent meals/snacks to make sure she is eating during the day - fruit with peanut butter, boiled eggs, wrap, vegetables with hummus, etc. LT: eat during the day, limit saturated fat <12g/day, limit Na <1.5/day, include 8 fruits/vegetables per day    Comments 41 y.o. F admitted to rehab for STEMI also  presenting with CAD, HLD, GERD. Past surgical hx includes cholecystectomy. Relevant medications include lipitor, hydrocodone. She smokes 4-5 cigarettes per day. [name] reports that she is getting food out more than usual due to her childrens sports schedule/traveling. Discussed better options to get when going out to eat, grilled foods, vegetables, lean proteins, no cream sauces, no fried foods. She reports eating vegetable plates and nothing during the day due to being busy at work. Discussed small frequent meals/snacks to make sure she is eating during the day - fruit with peanut butter, boiled eggs, wrap, vegetables with hummus, etc. Dsicussed heart healthy eating.             Nutrition Assessments:  MEDIFICTS Score Key: ?70 Need to make dietary changes  40-70 Heart Healthy Diet ? 40 Therapeutic Level Cholesterol Diet  Flowsheet Row Cardiac Rehab from 04/01/2021 in [facility] Cardiac and Pulmonary Rehab  Picture Your Plate Total Score on Admission 44      Picture Your Plate Scores: <62 Unhealthy dietary pattern with much room for improvement. 41-50 Dietary pattern unlikely to meet recommendations for good health and room for improvement. 51-60 More healthful dietary pattern, with some room for improvement.  >60 Healthy dietary pattern, although there may be some specific behaviors that could be improved.    Nutrition Goals Re-Evaluation:  Nutrition Goals Re-Evaluation     [place] Name 05/03/21 1743             Goals   Current Weight 168 lb (76.2 kg)       Nutrition Goal [place] more snacks throughout the day.       Comment Cordelia feels like she can go all day without eating and feel fine. Informed her about incorperating snacks throughout the day. She likes to have some canned soups because she gets home late. She likes vegatables and is going to try to [employer] for dinners.       Expected  Outcome Short: eat more snacks throughout the day. Long: adhere to a heart healthy diet that  pertains to her.                Nutrition Goals Discharge (Final Nutrition Goals Re-Evaluation):  Nutrition Goals Re-Evaluation - 05/03/21 1743       Goals   Current Weight 168 lb (76.2 kg)    Nutrition Goal [place] more snacks throughout the day.    Comment Dauna feels like she can go all day without eating and feel fine. Informed her about incorperating snacks throughout the day. She likes to have some canned soups because she gets home late. She likes vegatables and is going to try to [employer] for dinners.    Expected Outcome Short: eat more snacks throughout the day. Long: adhere to a heart healthy diet that pertains to her.             Psychosocial: Target Goals: Acknowledge presence or absence of significant depression and/or stress, maximize coping skills, provide positive support system. Participant is able to verbalize types and ability to use techniques and skills needed for reducing stress and depression.   Education: Stress, Anxiety, and Depression - Group verbal and visual presentation to define topics covered.  Reviews how body is impacted by stress, anxiety, and depression.  Also discusses healthy ways to reduce stress and to treat/manage anxiety and depression.  Written material given at graduation. Flowsheet Row Cardiac Rehab from 05/19/2021 in [facility] Cardiac and Pulmonary Rehab  Date 04/07/21  Educator [facility]  Instruction Review Code 1- Verbalizes Understanding       Education: Sleep Hygiene -Provides group verbal and written instruction about how sleep can affect your health.  Define sleep hygiene, discuss sleep cycles and impact of sleep habits. Review good sleep hygiene tips.    Initial Review & Psychosocial Screening:  Initial Psych Review & Screening - 03/17/21 1412       Initial Review   Current issues with Current Stress Concerns      Family Dynamics   Good Support System? Yes   husband     Barriers   Psychosocial barriers to participate in program  There are no identifiable barriers or psychosocial needs.;The patient should benefit from training in stress management and relaxation.      Screening Interventions   Interventions Encouraged to exercise;Provide feedback about the scores to participant;To provide support and resources with identified psychosocial needs    Expected Outcomes Short Term goal: Utilizing psychosocial counselor, staff and physician to assist with identification of specific Stressors or current issues interfering with healing process. Setting desired goal for each stressor or current issue identified.;Long Term Goal: Stressors or current issues are controlled or eliminated.;Short Term goal: Identification and review with participant of any Quality of Life or Depression concerns found by scoring the questionnaire.;Long Term goal: The participant improves quality of Life and PHQ9 Scores as seen by post scores and/or verbalization of changes             Quality of Life Scores:   Quality of Life - 04/01/21 1518       Quality of Life   Select Quality of Life      Quality of Life Scores   Health/Function Pre 24.07 %    Socioeconomic Pre 20.69 %    Psych/Spiritual Pre 25.64 %    Family Pre 30 %    GLOBAL Pre 24.46 %            Scores of  19 and below usually indicate a poorer quality of life in these areas.  A difference of  2-3 points is a clinically meaningful difference.  A difference of 2-3 points in the total score of the Quality of Life Index has been associated with significant improvement in overall quality of life, self-image, physical symptoms, and general health in studies assessing change in quality of life.  PHQ-9: Recent Review Flowsheet Data     Depression screen [facility] 2/9 04/01/2021   Decreased Interest 0   Down, Depressed, Hopeless 1   PHQ - 2 Score 1   Altered sleeping 0   Tired, decreased energy 1   Change in appetite 1   Feeling bad or failure about yourself  0   Trouble concentrating 0    Moving slowly or fidgety/restless 0   Suicidal thoughts 0   PHQ-9 Score 3   Difficult doing work/chores Not difficult at all      Interpretation of Total Score  Total Score Depression Severity:  1-4 = Minimal depression, 5-9 = Mild depression, 10-14 = Moderate depression, 15-19 = Moderately severe depression, 20-27 = Severe depression   Psychosocial Evaluation and Intervention:  Psychosocial Evaluation - 03/17/21 1426       Psychosocial Evaluation & Interventions   Interventions Encouraged to exercise with the program and follow exercise prescription    Comments Joany is coming to cardiac rehab post STEMI w/ stent. She reports feeling well and is planning on returning back to work Monday after Thanksgiving. She has a great support system at home. She stated her biggest stressor is trying to figure out all she needs to do and change after this heart attack, so she is ready to get started in the program. Her daughter keeps her busy with travel ball and she typically works 6a-4p so eating heatlhy can be a struggle.    Expected Outcomes Short: attend cardiac rehab for education and exercise. Long: develop and maintain positive self care habits.    Continue Psychosocial Services  Follow up required by staff             Psychosocial Re-Evaluation:  Psychosocial Re-Evaluation     [place] Name 05/03/21 1737             Psychosocial Re-Evaluation   Current issues with Current Anxiety/Panic       Comments Marian states that she may have some anxiety but is going to see her primary tomorrow for treatment. She is to inform staff if she is prescribed medication. Her doctor thinks it may be anxiety causing some palpitations.       Expected Outcomes Short: inform staff of medication and mood changes. Long: maintain a healthy stress free environment.       Interventions Encouraged to attend Cardiac Rehabilitation for the exercise;Stress management education;Relaxation education        Continue Psychosocial Services  Follow up required by staff                Psychosocial Discharge (Final Psychosocial Re-Evaluation):  Psychosocial Re-Evaluation - 05/03/21 1737       Psychosocial Re-Evaluation   Current issues with Current Anxiety/Panic    Comments Esme states that she may have some anxiety but is going to see her primary tomorrow for treatment. She is to inform staff if she is prescribed medication. Her doctor thinks it may be anxiety causing some palpitations.    Expected Outcomes Short: inform staff of medication and mood changes. Long: maintain a healthy stress free  environment.    Interventions Encouraged to attend Cardiac Rehabilitation for the exercise;Stress management education;Relaxation education    Continue Psychosocial Services  Follow up required by staff             Vocational Rehabilitation: Provide vocational rehab assistance to qualifying candidates.   Vocational Rehab Evaluation & Intervention:  Vocational Rehab - 03/17/21 1412       Initial Vocational Rehab Evaluation & Intervention   Assessment shows need for Vocational Rehabilitation No             Education: Education Goals: Education classes will be provided on a variety of topics geared toward better understanding of heart health and risk factor modification. Participant will state understanding/return demonstration of topics presented as noted by education test scores.  Learning Barriers/Preferences:  Learning Barriers/Preferences - 03/17/21 1411       Learning Barriers/Preferences   Learning Barriers None    Learning Preferences None             General Cardiac Education Topics:  AED/CPR: - Group verbal and written instruction with the use of models to demonstrate the basic use of the AED with the basic ABC's of resuscitation.   Anatomy and Cardiac Procedures: - Group verbal and visual presentation and models provide information about basic cardiac anatomy  and function. Reviews the testing methods done to diagnose heart disease and the outcomes of the test results. Describes the treatment choices: Medical Management, Angioplasty, or Coronary Bypass Surgery for treating various heart conditions including Myocardial Infarction, Angina, Valve Disease, and Cardiac Arrhythmias.  Written material given at graduation. Flowsheet Row Cardiac Rehab from 05/19/2021 in [facility] Cardiac and Pulmonary Rehab  Date 04/28/21  Educator [facility]  Instruction Review Code 1- Verbalizes Understanding       Medication Safety: - Group verbal and visual instruction to review commonly prescribed medications for heart and lung disease. Reviews the medication, class of the drug, and side effects. Includes the steps to properly store meds and maintain the prescription regimen.  Written material given at graduation.   Intimacy: - Group verbal instruction through game format to discuss how heart and lung disease can affect sexual intimacy. Written material given at graduation..   Know Your Numbers and Heart Failure: - Group verbal and visual instruction to discuss disease risk factors for cardiac and pulmonary disease and treatment options.  Reviews associated critical values for Overweight/Obesity, Hypertension, Cholesterol, and Diabetes.  Discusses basics of heart failure: signs/symptoms and treatments.  Introduces Heart Failure Zone chart for action plan for heart failure.  Written material given at graduation.   Infection Prevention: - Provides verbal and written material to individual with discussion of infection control including proper hand washing and proper equipment cleaning during exercise session. Flowsheet Row Cardiac Rehab from 05/19/2021 in [facility] Cardiac and Pulmonary Rehab  Date 04/01/21  Educator AS  Instruction Review Code 1- Verbalizes Understanding       Falls Prevention: - Provides verbal and written material to individual with discussion of falls prevention  and safety. Flowsheet Row Cardiac Rehab from 05/19/2021 in [facility] Cardiac and Pulmonary Rehab  Date 04/01/21  Educator AS  Instruction Review Code 1- Verbalizes Understanding       Other: -Provides group and verbal instruction on various topics (see comments)   Knowledge Questionnaire Score:  Knowledge Questionnaire Score - 04/01/21 1519       Knowledge Questionnaire Score   Pre Score 25/26             Core  Components/Risk Factors/Patient Goals at Admission:  Personal Goals and Risk Factors at Admission - 04/01/21 1517       Core Components/Risk Factors/Patient Goals on Admission   Tobacco Cessation Yes    Intervention Assist the participant in steps to quit. Provide individualized education and counseling about committing to Tobacco Cessation, relapse prevention, and pharmacological support that can be provided by physician.;Advice worker, assist with locating and accessing local/national Quit Smoking programs, and support quit date choice.   gave [employer] packet   Expected Outcomes Short Term: Will demonstrate readiness to quit, by selecting a quit date.;Short Term: Will quit all tobacco product use, adhering to prevention of relapse plan.;Long Term: Complete abstinence from all tobacco products for at least 12 months from quit date.    Lipids Yes    Intervention Provide education and support for participant on nutrition & aerobic/resistive exercise along with prescribed medications to achieve LDL <34m, HDL >432m    Expected Outcomes Short Term: Participant states understanding of desired cholesterol values and is compliant with medications prescribed. Participant is following exercise prescription and nutrition guidelines.;Long Term: Cholesterol controlled with medications as prescribed, with individualized exercise RX and with personalized nutrition plan. Value goals: LDL < 7022mHDL > 40 mg.             Education:Diabetes - Individual verbal and  written instruction to review signs/symptoms of diabetes, desired ranges of glucose level fasting, after meals and with exercise. Acknowledge that pre and post exercise glucose checks will be done for 3 sessions at entry of program.   Core Components/Risk Factors/Patient Goals Review:   Goals and Risk Factor Review     Row Name 05/03/21 1753             Core Components/Risk Factors/Patient Goals Review   Personal Goals Review Weight Management/Obesity       Review AngHailynn going to work on weight loss and eating snacks throughout the day. She would like to reach a lower weight. Sometimes she will go all day without eating due to her job.       Expected Outcomes Short: lose a few pounds in the next couple weeks. Long: reach weight goal.                Core Components/Risk Factors/Patient Goals at Discharge (Final Review):   Goals and Risk Factor Review - 05/03/21 1753       Core Components/Risk Factors/Patient Goals Review   Personal Goals Review Weight Management/Obesity    Review AngKadedra going to work on weight loss and eating snacks throughout the day. She would like to reach a lower weight. Sometimes she will go all day without eating due to her job.    Expected Outcomes Short: lose a few pounds in the next couple weeks. Long: reach weight goal.             ITP Comments:  ITP Comments     Row Name 03/17/21 1424 04/01/21 1531 04/05/21 1714 04/14/21 1651 04/21/21 0647   ITP Comments Initial telephone orientation completed. [facility]. EP orientation scheduled for Thursday 12/8 at 2pm. Completed 6MWT and gym orientation. Initial ITP created and sent for review to Dr. MarEmily Filbertedical Director. First full day of exercise!  Patient was oriented to gym and equipment including functions, settings, policies, and procedures.  Patient's individual exercise prescription and treatment plan were reviewed.  All starting workloads were established based  on the results  of the 6 minute walk test done at initial orientation visit.  The plan for exercise progression was also introduced and progression will be customized based on patient's performance and goals. Completed initial RD consultation 30 Day review completed. Medical Director ITP review done, changes made as directed, and signed approval by Medical Director.    [place] Name 05/19/21 0102 06/15/21 1543 06/16/21 0746       ITP Comments 30 Day review completed. Medical Director ITP review done, changes made as directed, and signed approval by Medical Director. [name] has not attended since 2/9 for various reasons, primarily work commitments 30 Day review completed. Medical Director ITP review done, changes made as directed, and signed approval by Medical Director.    3 visits this month              Comments:

## 2021-06-16 NOTE — Telephone Encounter (Signed)
Called Morgan Mcdowell as she has been out since 2/9. She had cancelled class for a toothache 2/13 and we have not heard back since. Called to check in.

## 2021-06-23 ENCOUNTER — Encounter: Payer: BC Managed Care – PPO | Attending: Cardiovascular Disease

## 2021-06-23 ENCOUNTER — Telehealth: Payer: Self-pay

## 2021-06-23 DIAGNOSIS — I213 ST elevation (STEMI) myocardial infarction of unspecified site: Secondary | ICD-10-CM

## 2021-06-23 DIAGNOSIS — Z955 Presence of coronary angioplasty implant and graft: Secondary | ICD-10-CM | POA: Insufficient documentation

## 2021-06-23 NOTE — Telephone Encounter (Signed)
Left message x2 for patient- has not attended cardiac rehab since 2/9. Will send letter. ?

## 2021-06-30 ENCOUNTER — Telehealth: Payer: Self-pay | Admitting: Cardiovascular Disease

## 2021-06-30 NOTE — Telephone Encounter (Signed)
? ?  Pre-operative Risk Assessment  ?  ?Patient Name: Morgan Mcdowell  ?DOB: Oct 27, 1980 ?MRN: [id number]  ? ? ? ?Request for Surgical Clearance   ? ?Procedure:  Dental Extraction - Amount of Teeth to be Pulled:  4 ? ?Date of Surgery:  Clearance TBD                              ?   ?Surgeon:  Rosendo Gros, DDS ?Surgeon's Group or Practice Name:  [facility] ?Phone number:  [phone] ?Fax number:  [phone] ?  ?Type of Clearance Requested:   ?- Medical  ?  ?Type of Anesthesia:   IV deep sedation ?  ?Additional requests/questions:   ? ?Signed, ?Morrie Sheldon Gerringer   ?06/30/2021, 3:48 PM  ? ?

## 2021-06-30 NOTE — Telephone Encounter (Signed)
Patient has an upcoming appointment with Ward Givens, NP on 07/08/21. Encounter will be routed to him for clearance at the office visit.  ? ? ?

## 2021-07-08 ENCOUNTER — Other Ambulatory Visit: Payer: Self-pay

## 2021-07-08 ENCOUNTER — Encounter: Payer: Self-pay | Admitting: Nurse Practitioner

## 2021-07-08 ENCOUNTER — Ambulatory Visit (INDEPENDENT_AMBULATORY_CARE_PROVIDER_SITE_OTHER): Payer: BC Managed Care – PPO | Admitting: Nurse Practitioner

## 2021-07-08 VITALS — BP 112/70 | HR 91 | Ht 65.0 in | Wt 167.2 lb

## 2021-07-08 DIAGNOSIS — Z01810 Encounter for preprocedural cardiovascular examination: Secondary | ICD-10-CM | POA: Diagnosis not present

## 2021-07-08 DIAGNOSIS — I2583 Coronary atherosclerosis due to lipid rich plaque: Secondary | ICD-10-CM | POA: Diagnosis not present

## 2021-07-08 DIAGNOSIS — I2510 Atherosclerotic heart disease of native coronary artery without angina pectoris: Secondary | ICD-10-CM | POA: Diagnosis not present

## 2021-07-08 DIAGNOSIS — Z720 Tobacco use: Secondary | ICD-10-CM | POA: Diagnosis not present

## 2021-07-08 DIAGNOSIS — E785 Hyperlipidemia, unspecified: Secondary | ICD-10-CM | POA: Diagnosis not present

## 2021-07-08 NOTE — Progress Notes (Signed)
Cardiac Individual Treatment Plan ? ?Patient Details  ?Name: Morgan Mcdowell ?MRN: [id number] ?Date of Birth: [birth year] ?Referring Provider:   ?Flowsheet Row Cardiac Rehab from 04/01/2021 in [facility] Cardiac and Pulmonary Rehab  ?Referring Provider Arida  ? ?  ? ? ?Initial Encounter Date:  ?Flowsheet Row Cardiac Rehab from 04/01/2021 in [facility] Cardiac and Pulmonary Rehab  ?Date 04/01/21  ? ?  ? ? ?Visit Diagnosis: No diagnosis found. ? ?Patient's Home Medications on Admission: ? ?Current Outpatient Medications:  ?  amoxicillin (AMOXIL) 500 MG tablet, Take 500 mg by mouth 3 (three) times daily. (Patient not taking: Reported on 07/08/2021), Disp: , Rfl:  ?  aspirin 81 MG chewable tablet, Chew 1 tablet (81 mg total) by mouth daily., Disp: 90 tablet, Rfl: 3 ?  atorvastatin (LIPITOR) 80 MG tablet, Take 1 tablet (80 mg total) by mouth daily., Disp: 90 tablet, Rfl: 3 ?  ezetimibe (ZETIA) 10 MG tablet, Take 1 tablet (10 mg total) by mouth daily., Disp: 90 tablet, Rfl: 3 ?  metoprolol succinate (TOPROL-XL) 25 MG 24 hr tablet, Take 0.5 tablets (12.5 mg total) by mouth daily. Take with or immediately following a meal., Disp: 45 tablet, Rfl: 3 ?  nitroGLYCERIN (NITROSTAT) 0.4 MG SL tablet, Place 1 tablet (0.4 mg total) under the tongue every 5 (five) minutes as needed for chest pain., Disp: 25 tablet, Rfl: 2 ?  prasugrel (EFFIENT) 10 MG TABS tablet, Take 1 tablet (10 mg total) by mouth daily., Disp: 90 tablet, Rfl: 3 ?  sertraline (ZOLOFT) 50 MG tablet, Take 50 mg by mouth daily. (Patient not taking: Reported on 07/08/2021), Disp: , Rfl:  ? ?Past Medical History: ?Past Medical History:  ?Diagnosis Date  ? CAD (coronary artery disease)   ? a. 02/2021 Inf STEMI/PCI: LM nl, LCX nl, OM1/2/3 nl, RCA 30p, RPAV 100 (2.5x12 Onyx Frontier DES), RPL1 nl, RPL2 100. EF 50-55%.  ? Ectopic pregnancy   ? GERD (gastroesophageal reflux disease)   ? OCC-TUMS PRN  ? History of echocardiogram   ? a. 02/2021 Echo: EF 60-65%, no nrwma, nl RV size/fxn, triv  MR.  ? Hyperlipidemia LDL goal <70   ? Latent tuberculosis by blood test   ? Psoriasis   ? Tobacco abuse   ? ? ?Tobacco Use: ?Social History  ? ?Tobacco Use  ?Smoking Status Every Day  ? Packs/day: 1.00  ? Years: 15.00  ? Pack years: 15.00  ? Types: Cigarettes  ? Last attempt to quit: 03/04/2021  ? Years since quitting: 0.3  ?Smokeless Tobacco Never  ?Tobacco Comments  ? 04/09/21 5 cigarettes per day  ? ? ?Labs: ?Recent Review Flowsheet Data   ? ? Labs for ITP Cardiac and Pulmonary Rehab Latest Ref Rng & Units 03/06/2021 04/09/2021 06/14/2021  ? Cholestrol 100 - 199 mg/dL 185(U) 314 970  ? LDLCALC 0 - 99 mg/dL 263(Z) 93 63  ? HDL >39 mg/dL 85(Y) 85(O) 27(X)  ? Trlycerides 0 - 149 mg/dL 84 412(I) 97  ? Hemoglobin A1c 4.8 - 5.6 % 6.2(H) - -  ? ?  ? ? ? ?Exercise Target Goals: ?Exercise Program Goal: ?Individual exercise prescription set using results from initial 6 min walk test and THRR while considering  patient?s activity barriers and safety.  ? ?Exercise Prescription Goal: ?Initial exercise prescription builds to 30-45 minutes a day of aerobic activity, 2-3 days per week.  Home exercise guidelines will be given to patient during program as part of exercise prescription that the participant will acknowledge. ? ? ?  Education: Aerobic Exercise: ?- Group verbal and visual presentation on the components of exercise prescription. Introduces F.I.T.T principle from ACSM for exercise prescriptions.  Reviews F.I.T.T. principles of aerobic exercise including progression. Written material given at graduation. ? ? ?Education: Resistance Exercise: ?- Group verbal and visual presentation on the components of exercise prescription. Introduces F.I.T.T principle from ACSM for exercise prescriptions  Reviews F.I.T.T. principles of resistance exercise including progression. Written material given at graduation. ?Flowsheet Row Cardiac Rehab from 05/19/2021 in [facility] Cardiac and Pulmonary Rehab  ?Date 04/28/21  ?Educator AS  ?Instruction  Review Code 1- Verbalizes Understanding  ? ?  ? ?  ?Education: Exercise & Equipment Safety: ?- Individual verbal instruction and demonstration of equipment use and safety with use of the equipment. ?Flowsheet Row Cardiac Rehab from 05/19/2021 in [facility] Cardiac and Pulmonary Rehab  ?Date 04/01/21  ?Educator AS  ?Instruction Review Code 5- Refused Teaching  ? ?  ? ? ?Education: Exercise Physiology & General Exercise Guidelines: ?- Group verbal and written instruction with models to review the exercise physiology of the cardiovascular system and associated critical values. Provides general exercise guidelines with specific guidelines to those with heart or lung disease.  ?Flowsheet Row Cardiac Rehab from 05/19/2021 in [facility] Cardiac and Pulmonary Rehab  ?Date 04/14/21  ?Educator AS  ?Instruction Review Code 1- Verbalizes Understanding  ? ?  ? ? ?Education: Flexibility, Balance, Mind/Body Relaxation: ?- Group verbal and visual presentation with interactive activity on the components of exercise prescription. Introduces F.I.T.T principle from ACSM for exercise prescriptions. Reviews F.I.T.T. principles of flexibility and balance exercise training including progression. Also discusses the mind body connection.  Reviews various relaxation techniques to help reduce and manage stress (i.e. Deep breathing, progressive muscle relaxation, and visualization). Balance handout provided to take home. Written material given at graduation. ? ? ?Activity Barriers & Risk Stratification: ? Activity Barriers & Cardiac Risk Stratification - 03/17/21 1407   ? ?  ? Activity Barriers & Cardiac Risk Stratification  ? Activity Barriers None   ? Cardiac Risk Stratification Moderate   ? ?  ?  ? ?  ? ? ?6 Minute Walk: ? 6 Minute Walk   ? ? Row Name 04/01/21 1505  ?  ?  ?  ? 6 Minute Walk  ? Phase Initial    ? Distance 1375 feet    ? Walk Time 6 minutes    ? # of Rest Breaks 0    ? MPH 2.6    ? METS 4.53    ? RPE 7    ? Perceived Dyspnea  0    ? VO2  Peak 15.86    ? Symptoms No    ? Resting HR 82 bpm    ? Resting BP 90/56    ? Resting Oxygen Saturation  97 %    ? Exercise Oxygen Saturation  during 6 min walk 98 %    ? Max Ex. HR 112 bpm    ? Max Ex. BP 114/60    ? 2 Minute Post BP 90/56    ? ?  ?  ? ?  ? ? ?Oxygen Initial Assessment: ? ? ?Oxygen Re-Evaluation: ? ? ?Oxygen Discharge (Final Oxygen Re-Evaluation): ? ? ?Initial Exercise Prescription: ? Initial Exercise Prescription - 04/01/21 1500   ? ?  ? Date of Initial Exercise RX and Referring Provider  ? Date 04/01/21   ? Referring Provider Arida   ?  ? Treadmill  ? MPH 2.6   ? Grade 2   ?  Minutes 15   ? METs 3.7   ?  ? Recumbant Bike  ? Level 3   ? RPM 60   ? Watts 45   ? Minutes 15   ? METs 3.7   ?  ? NuStep  ? Level 3   ? SPM 80   ? Minutes 15   ? METs 3.7   ?  ? Elliptical  ? Level 1   ? Speed 2.5   ? Minutes 15   ? METs 3.7   ?  ? REL-XR  ? Level 3   ? Speed 50   ? Minutes 15   ? METs 3.7   ?  ? T5 Nustep  ? Level 2   ? SPM 80   ? Minutes 15   ? METs 3.7   ?  ? Prescription Details  ? Frequency (times per week) 3   ? Duration Progress to 30 minutes of continuous aerobic without signs/symptoms of physical distress   ?  ? Intensity  ? THRR 40-80% of Max Heartrate 121-160   ? Ratings of Perceived Exertion 11-15   ? Perceived Dyspnea 0-4   ?  ? Resistance Training  ? Training Prescription Yes   ? Weight 3 lb   ? Reps 10-15   ? ?  ?  ? ?  ? ? ?Perform Capillary Blood Glucose checks as needed. ? ?Exercise Prescription Changes: ? ? Exercise Prescription Changes   ? ? Row Name 04/01/21 1500 04/07/21 1300 04/21/21 1200 05/04/21 0800 05/17/21 1200  ?  ? Response to Exercise  ? Blood Pressure (Admit) 90/56 96/64 104/60 90/56 90/58   ? Blood Pressure (Exercise) 114/60 122/68 128/68 112/58 108/62  ? Blood Pressure (Exit) 90/56 102/60 112/64 104/62 104/60  ? Heart Rate (Admit) 82 bpm 84 bpm 85 bpm 65 bpm 78 bpm  ? Heart Rate (Exercise) 112 bpm 122 bpm 152 bpm 117 bpm 133 bpm  ? Heart Rate (Exit) 86 bpm 103 bpm 106 bpm  101 bpm 103 bpm  ? Oxygen Saturation (Admit) 97 % -- -- -- --  ? Oxygen Saturation (Exercise) 98 % -- -- -- --  ? Rating of Perceived Exertion (Exercise) 7 12 15 13 13   ? Perceived Dyspnea (Exercise) 0 -- --

## 2021-07-08 NOTE — Patient Instructions (Signed)
Medication Instructions:  ?No changes at this time.  ? ?*If you need a refill on your cardiac medications before your next appointment, please call your pharmacy* ? ? ?Lab Work: ?None ? ?If you have labs (blood work) drawn today and your tests are completely normal, you will receive your results only by: ?MyChart Message (if you have MyChart) OR ?A paper copy in the mail ?If you have any lab test that is abnormal or we need to change your treatment, we will call you to review the results. ? ? ?Testing/Procedures: ?None ? ? ?Follow-Up: ?At [facility], you and your health needs are our priority.  As part of our continuing mission to provide you with exceptional heart care, we have created designated Provider Care Teams.  These Care Teams include your primary Cardiologist (physician) and Advanced Practice Providers (APPs -  Physician Assistants and Nurse Practitioners) who all work together to provide you with the care you need, when you need it. ? ? ?Your next appointment:   ?6 month(s) ? ?The format for your next appointment:   ?In Person ? ?Provider:   ?Muhammad Arida, MD ?

## 2021-07-08 NOTE — Progress Notes (Addendum)
Discharge Progress Report ? ?Patient Details  ?Name: Morgan Mcdowell ?MRN: [id number] ?Date of Birth: 07/23/1980 ?Referring Provider:   ?Flowsheet Row Cardiac Rehab from 04/01/2021 in [facility]  ?Referring Provider Arida  ? ?  ? ? ? ?Number of Visits: 19 ? ?Reason for Discharge:  ?Early Exit:  Lack of attendance/ Tooth problem, no response back ? ?Smoking History:  ?Social History  ? ?Tobacco Use  ?Smoking Status Every Day  ? Packs/day: 1.00  ? Years: 15.00  ? Pack years: 15.00  ? Types: Cigarettes  ? Last attempt to quit: 03/04/2021  ? Years since quitting: 0.3  ?Smokeless Tobacco Never  ?Tobacco Comments  ? 04/09/21 5 cigarettes per day  ? ? ?Diagnosis:  ?No diagnosis found. ? ?ADL [facility]: ? ? ?Initial Exercise Prescription: ? Initial Exercise Prescription - 04/01/21 1500   ? ?  ? Date of Initial Exercise RX and Referring Provider  ? Date 04/01/21   ? Referring Provider Arida   ?  ? Treadmill  ? MPH 2.6   ? Grade 2   ? Minutes 15   ? METs 3.7   ?  ? Recumbant Bike  ? Level 3   ? RPM 60   ? Watts 45   ? Minutes 15   ? METs 3.7   ?  ? NuStep  ? Level 3   ? SPM 80   ? Minutes 15   ? METs 3.7   ?  ? Elliptical  ? Level 1   ? Speed 2.5   ? Minutes 15   ? METs 3.7   ?  ? REL-XR  ? Level 3   ? Speed 50   ? Minutes 15   ? METs 3.7   ?  ? T5 Nustep  ? Level 2   ? SPM 80   ? Minutes 15   ? METs 3.7   ?  ? Prescription Details  ? Frequency (times per week) 3   ? Duration Progress to 30 minutes of continuous aerobic without signs/symptoms of physical distress   ?  ? Intensity  ? THRR 40-80% of Max Heartrate 121-160   ? Ratings of Perceived Exertion 11-15   ? Perceived Dyspnea 0-4   ?  ? Resistance Training  ? Training Prescription Yes   ? Weight 3 lb   ? Reps 10-15   ? ?  ?  ? ?  ? ? ?Discharge Exercise Prescription (Final Exercise Prescription Changes): ? Exercise Prescription Changes - 06/01/21 0700   ? ?  ? Response to Exercise  ? Blood Pressure (Admit) 116/64   ? Blood Pressure (Exit) 100/64   ? Heart  Rate (Admit) 60 bpm   ? Heart Rate (Exercise) 113 bpm   ? Heart Rate (Exit) 96 bpm   ? Rating of Perceived Exertion (Exercise) 12   ? Symptoms none   ? Duration Continue with 30 min of aerobic exercise without signs/symptoms of physical distress.   ? Intensity THRR unchanged   ?  ? Progression  ? Progression Continue to progress workloads to maintain intensity without signs/symptoms of physical distress.   ? Average METs 3.5   ?  ? Resistance Training  ? Training Prescription Yes   ? Weight 6 lb   ? Reps 10-15   ?  ? Interval Training  ? Interval Training No   ?  ? Treadmill  ? MPH 2.8   ? Grade 3.5   ? Minutes 15   ?  METs 4.49   ?  ? Track  ? Laps 36   ? Minutes 15   ? METs 2.96   ?  ? Oxygen  ? Maintain Oxygen Saturation 88% or higher   ? ?  ?  ? ?  ? ? ?Functional Capacity: ? 6 Minute Walk   ? ? Row Name 04/01/21 1505  ?  ?  ?  ? 6 Minute Walk  ? Phase Initial    ? Distance 1375 feet    ? Walk Time 6 minutes    ? # of Rest Breaks 0    ? MPH 2.6    ? METS 4.53    ? RPE 7    ? Perceived Dyspnea  0    ? VO2 Peak 15.86    ? Symptoms No    ? Resting HR 82 bpm    ? Resting BP 90/56    ? Resting Oxygen Saturation  97 %    ? Exercise Oxygen Saturation  during 6 min walk 98 %    ? Max Ex. HR 112 bpm    ? Max Ex. BP 114/60    ? 2 Minute Post BP 90/56    ? ?  ?  ? ?  ? ? ?Nutrition & Weight - Outcomes: ? Pre Biometrics - 04/01/21 1517   ? ?  ? Pre Biometrics  ? Height 5\' 5"  (1.651 m)   ? Weight 173 lb 1.6 oz (78.5 kg)   ? BMI (Calculated) 28.81   ? Single Leg Stand 30 seconds   ? ?  ?  ? ?  ? ? ?Nutrition: ? Nutrition Therapy & Goals - 04/14/21 1637   ? ?  ? Nutrition Therapy  ? Diet Heart healthy, low Na   ? Drug/Food Interactions Statins/Certain Fruits   ? Protein (specify units) 60g   ? Fiber 30 grams   ? Whole Grain Foods 3 servings   ? Saturated Fats 12 max. grams   ? Fruits and Vegetables 8 servings/day   ? Sodium 1.5 grams   ?  ? Personal Nutrition Goals  ? Nutrition Goal ST: 2-3 small frequent meals/snacks to make  sure she is eating during the day - fruit with peanut butter, boiled eggs, wrap, vegetables with hummus, etc. LT: eat during the day, limit saturated fat <12g/day, limit Na <1.5/day, include 8 fruits/vegetables per day   ? Comments 41 y.o. F admitted to rehab for STEMI also presenting with CAD, HLD, GERD. Past surgical hx includes cholecystectomy. Relevant medications include lipitor, hydrocodone. She smokes 4-5 cigarettes per day. Morgan Mcdowell reports that she is getting food out more than usual due to her childrens sports schedule/traveling. Discussed better options to get when going out to eat, grilled foods, vegetables, lean proteins, no cream sauces, no fried foods. She reports eating vegetable plates and nothing during the day due to being busy at work. Discussed small frequent meals/snacks to make sure she is eating during the day - fruit with peanut butter, boiled eggs, wrap, vegetables with hummus, etc. Dsicussed heart healthy eating.   ? ?  ?  ? ?  ? ? ? ?Goals reviewed with patient; copy given to patient. ?

## 2021-07-08 NOTE — Progress Notes (Signed)
? ? ?Office Visit  ?  ?Patient Name: Morgan Mcdowell ?Date of Encounter: 07/08/2021 ? ?Primary Care Provider:  Dortha KernBliss, Laura K, MD ?Primary Cardiologist:  Lorine BearsMuhammad Arida, MD ? ?Chief Complaint  ?  ?41 year old female with a history of tobacco abuse, psoriasis, latent tuberculosis, GERD, and CAD status post inferior STEMI with stenting of the RPAV in November 2022, presents for follow-up of CAD and preoperative evaluation. ? ?Past Medical History  ?  ?Past Medical History:  ?Diagnosis Date  ? CAD (coronary artery disease)   ? a. 02/2021 Inf STEMI/PCI: LM nl, LCX nl, OM1/2/3 nl, RCA 30p, RPAV 100 (2.5x12 Onyx Frontier DES), RPL1 nl, RPL2 100. EF 50-55%.  ? Ectopic pregnancy   ? GERD (gastroesophageal reflux disease)   ? OCC-TUMS PRN  ? History of echocardiogram   ? a. 02/2021 Echo: EF 60-65%, no nrwma, nl RV size/fxn, triv MR.  ? Hyperlipidemia LDL goal <70   ? Latent tuberculosis by blood test   ? Psoriasis   ? Tobacco abuse   ? ?Past Surgical History:  ?Procedure Laterality Date  ? CHOLECYSTECTOMY    ? CORONARY/GRAFT ACUTE MI REVASCULARIZATION N/A 03/06/2021  ? Procedure: Coronary/Graft Acute MI Revascularization;  Surgeon: Iran OuchArida, Muhammad A, MD;  Location: [facility] INVASIVE CV LAB;  Service: Cardiovascular;  Laterality: N/A;  ? DG GALL BLADDER    ? DIAGNOSTIC LAPAROSCOPY WITH REMOVAL OF ECTOPIC PREGNANCY N/A 12/11/2015  ? Procedure: DIAGNOSTIC LAPAROSCOPY WITH REMOVAL OF ECTOPIC PREGNANCY;  Surgeon: Conard NovakStephen D Jackson, MD;  Location: [facility] ORS;  Service: Gynecology;  Laterality: N/A;  ? DILATION AND CURETTAGE OF UTERUS    ? LAPAROSCOPIC BILATERAL SALPINGECTOMY Bilateral 12/11/2015  ? Procedure: LAPAROSCOPIC BILATERAL SALPINGECTOMY;  Surgeon: Conard NovakStephen D Jackson, MD;  Location: [facility] ORS;  Service: Gynecology;  Laterality: Bilateral;  ? LEFT HEART CATH AND CORONARY ANGIOGRAPHY N/A 03/06/2021  ? Procedure: LEFT HEART CATH AND CORONARY ANGIOGRAPHY;  Surgeon: Iran OuchArida, Muhammad A, MD;  Location: [facility] INV[facility]IVE CV LAB;  Service:  Cardiovascular;  Laterality: N/A;  ? ? ?Allergies ? ?No Known Allergies ? ?History of Present Illness  ?  ?41 year old female with above past medical history including tobacco abuse, psoriasis, latent tuberculosis, and GERD.  On March 06, 2021, she developed chest pain and was initially seen in urgent care, where she was found to have inferior ST segment elevation.  [facility] and underwent a diagnostic catheterization revealing occlusion of the RPAV and RPL 2, and otherwise nonobstructive disease.  The RPAV was successfully treated with a drug-eluting stent.  The RPL 2 was not felt to be amenable to PCI secondary to small size.  Troponin peaked at 24,000.  Echocardiogram showed an EF of 60 to 65% without regional wall motion abnormalities, and trivial mitral regurgitation. ? ?Ms. Morgan Mcdowell was last seen in cardiology clinic in December 2022, at which time she is doing well from a cardiac standpoint.  She has since stopped participating in cardiac rehabilitation after completing 19 sessions.  She continues to work full-time.  She does not experience chest pain or dyspnea.  She is still smoking 5 to 6 cigarettes/day.  She has been having significant oral pain and has been seen by oral surgery with recommendation for extraction of 4 teeth including an impacted wisdom tooth.  She is seeking preoperative risk assessment and clearance.  She denies palpitations, PND, orthopnea, dizziness, syncope, edema, or early satiety. ? ?Home Medications  ?  ?Current Outpatient Medications  ?Medication Sig Dispense Refill  ? aspirin 81 MG  chewable tablet Chew 1 tablet (81 mg total) by mouth daily. 90 tablet 3  ? atorvastatin (LIPITOR) 80 MG tablet Take 1 tablet (80 mg total) by mouth daily. 90 tablet 3  ? ezetimibe (ZETIA) 10 MG tablet Take 1 tablet (10 mg total) by mouth daily. 90 tablet 3  ? metoprolol succinate (TOPROL-XL) 25 MG 24 hr tablet Take 0.5 tablets (12.5 mg total) by mouth daily. Take with or  immediately following a meal. 45 tablet 3  ? nitroGLYCERIN (NITROSTAT) 0.4 MG SL tablet Place 1 tablet (0.4 mg total) under the tongue every 5 (five) minutes as needed for chest pain. 25 tablet 2  ? prasugrel (EFFIENT) 10 MG TABS tablet Take 1 tablet (10 mg total) by mouth daily. 90 tablet 3  ? amoxicillin (AMOXIL) 500 MG tablet Take 500 mg by mouth 3 (three) times daily. (Patient not taking: Reported on 07/08/2021)    ? sertraline (ZOLOFT) 50 MG tablet Take 50 mg by mouth daily. (Patient not taking: Reported on 07/08/2021)    ? ?No current facility-administered medications for this visit.  ?  ? ?Review of Systems  ?  ?Significant oral pain pending tooth extractions.  This has caused restless and difficulty sleeping at night which has resulted in daytime stress and she notes worsening of her psoriasis.  She denies chest pain, palpitations, dyspnea, pnd, orthopnea, n, v, dizziness, syncope, edema, weight gain, or early satiety.  All other systems reviewed and are otherwise negative except as noted above. ?  ? ?Physical Exam  ?  ?VS:  BP 112/70 (BP Location: Left Arm, Patient Position: Sitting, Cuff Size: Normal)   Pulse 91   Ht 5\' 5"  (1.651 m)   Wt 167 lb 4 oz (75.9 kg)   SpO2 97%   BMI 27.83 kg/m?  , BMI Body mass index is 27.83 kg/m?. ?    ?GEN: Well nourished, well developed, in no acute distress. ?HEENT: normal. ?Neck: Supple, no JVD, carotid bruits, or masses. ?Cardiac: RRR, no murmurs, rubs, or gallops. No clubbing, cyanosis, edema.  Radials/PT 2+ and equal bilaterally.  ?Respiratory:  Respirations regular and unlabored, clear to auscultation bilaterally. ?GI: Soft, nontender, nondistended, BS + x 4. ?MS: no deformity or atrophy. ?Skin: warm and dry, no rash.  Psoriatic rash to upper extremities. ?Neuro:  Strength and sensation are intact. ?Psych: Normal affect. ? ?Accessory Clinical Findings  ?  ?ECG personally reviewed by me today -regular sinus rhythm, 91, left atrial enlargement, inferior infarct- no  acute changes. ? ?Lab Results  ?Component Value Date  ? WBC 14.5 (H) 03/07/2021  ? HGB 11.6 (L) 03/07/2021  ? HCT 34.5 (L) 03/07/2021  ? MCV 91.8 03/07/2021  ? PLT 293 03/07/2021  ? ?Lab Results  ?Component Value Date  ? CREATININE 0.50 03/08/2021  ? BUN 12 03/08/2021  ? NA 137 03/08/2021  ? K 4.1 03/08/2021  ? CL 108 03/08/2021  ? CO2 25 03/08/2021  ? ?Lab Results  ?Component Value Date  ? ALT 10 06/14/2021  ? AST 9 06/14/2021  ? ALKPHOS 98 06/14/2021  ? BILITOT 0.2 06/14/2021  ? ?Lab Results  ?Component Value Date  ? CHOL 117 06/14/2021  ? HDL 35 (L) 06/14/2021  ? LDLCALC 63 06/14/2021  ? TRIG 97 06/14/2021  ? CHOLHDL 3.3 06/14/2021  ?  ?Lab Results  ?Component Value Date  ? HGBA1C 6.2 (H) 03/06/2021  ? ? ?Assessment & Plan  ?  ?1.  Coronary artery disease/preoperative cardiovascular risk examination: Status post inferior STEMI in  November 2022 with drug-eluting stent placement to the RPAV and residual RPL 1 occlusion.  She has done well without chest pain or dyspnea and has tolerated medications well including aspirin, statin, Zetia, beta-blocker, and prasugrel.  She is pending extraction of 4 teeth.  This is a low risk procedure to be done under deep sedation per request notes.  Her risk of major cardiac event related to the procedure calculates to 0.9%.  Notably, in the setting of recent STEMI just 4 months ago, she is not in a position to come off of dual antiplatelet therapy at this time.  Unless the surgical team is comfortable proceeding on dual antiplatelet therapy, ideally would defer teeth extractions at least for another 2 months, at which time she would be able to briefly come off of prasugrel therapy (ideally remain on aspirin throughout procedure), and then resume prasugrel after the extractions.   ? ?2.  Hyperlipidemia: On high potency statin therapy, LDL has improved significantly from 172 at time of hospitalization, down to 63 currently.  She is tolerating medication well. ? ?3.  Tobacco abuse:  Still smoking 5 to 6 cigarettes/day.  She has prescription for Chantix and does plan on using it at some point, but just is not ready to quit.  Complete cessation advised. ? ?4.  Dental pain: See #1.  She is not at a point t

## 2021-12-07 IMAGING — DX DG CHEST 1V PORT
1 series · 1 of 1 positions shown · non-contrast
Comparison: Chest radiograph dated October 23, 2020

CLINICAL DATA: Chest pain

EXAM:
PORTABLE CHEST 1 VIEW

[chest ap]
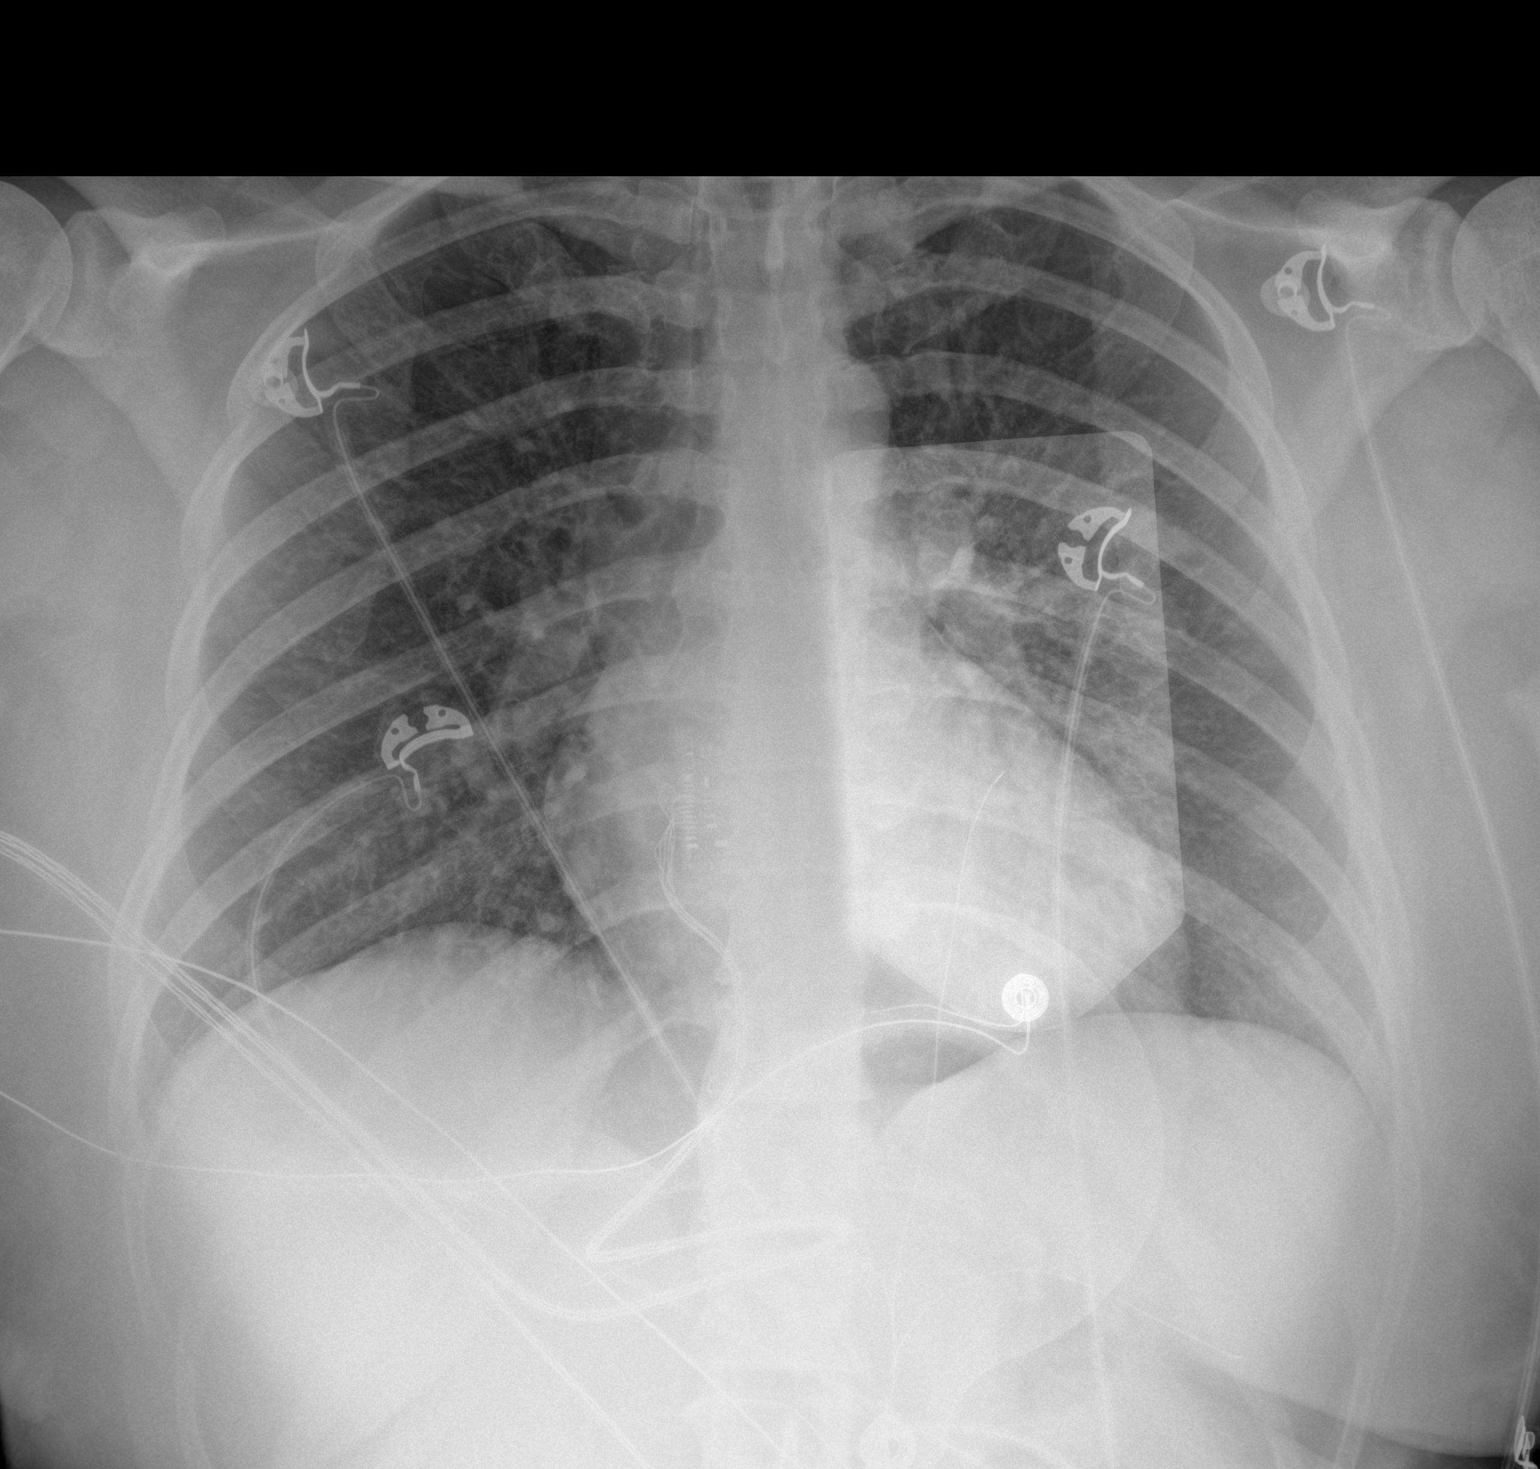

[1 of 1 positions shown; findings below may reference images not displayed]

FINDINGS: The heart size and mediastinal contours are within normal limits.
Both lungs are clear. The visualized skeletal structures are
unremarkable.
IMPRESSION: No active disease.

## 2022-01-03 NOTE — Progress Notes (Signed)
Requested PCP notes.  

## 2022-01-11 ENCOUNTER — Ambulatory Visit: Payer: BC Managed Care – PPO | Attending: Cardiovascular Disease | Admitting: Cardiovascular Disease

## 2022-01-11 ENCOUNTER — Encounter: Payer: Self-pay | Admitting: Cardiovascular Disease

## 2022-01-11 VITALS — BP 104/60 | HR 92 | Ht 65.0 in | Wt 176.2 lb

## 2022-01-11 DIAGNOSIS — I2510 Atherosclerotic heart disease of native coronary artery without angina pectoris: Secondary | ICD-10-CM

## 2022-01-11 DIAGNOSIS — E7800 Pure hypercholesterolemia, unspecified: Secondary | ICD-10-CM

## 2022-01-11 DIAGNOSIS — Z720 Tobacco use: Secondary | ICD-10-CM

## 2022-01-11 MED ORDER — VARENICLINE TARTRATE 1 MG PO TABS: 1.0000 mg | ORAL_TABLET | Freq: Two times a day (BID) | ORAL | 1 refills | Status: DC

## 2022-01-11 MED ORDER — CHANTIX STARTING MONTH PAK 0.5 MG X 11 & 1 MG X 42 PO TBPK
ORAL_TABLET | ORAL | 0 refills | Status: DC
Start: 2022-01-11 — End: 2022-07-12

## 2022-01-11 NOTE — Patient Instructions (Signed)
Medication Instructions:  Your physician has recommended you make the following change in your medication:   START Chantix as directed. An Rx has been sent to your pharmacy  - Take 0.5 mg daily for 3 days - Then Take 0.5 mg twice a day for 4 days - Then Take 1 mg twice daily  *If you need a refill on your cardiac medications before your next appointment, please call your pharmacy*   Lab Work: None ordered If you have labs (blood work) drawn today and your tests are completely normal, you will receive your results only by: [place] (if you have MyChart) OR A paper copy in the mail If you have any lab test that is abnormal or we need to change your treatment, we will call you to review the results.   Testing/Procedures: None ordered   Follow-Up: At [facility], you and your health needs are our priority.  As part of our continuing mission to provide you with exceptional heart care, we have created designated Provider Care Teams.  These Care Teams include your primary Cardiologist (physician) and Advanced Practice Providers (APPs -  Physician Assistants and Nurse Practitioners) who all work together to provide you with the care you need, when you need it.  We recommend signing up for the patient portal called "MyChart".  Sign up information is provided on this After Visit Summary.  MyChart is used to connect with patients for Virtual Visits (Telemedicine).  Patients are able to view lab/test results, encounter notes, upcoming appointments, etc.  Non-urgent messages can be sent to your provider as well.   To learn more about what you can do with MyChart, go to [email or web address].    Your next appointment:   6 month(s)  The format for your next appointment:   In Person  Provider:   You may see Kathlyn Sacramento, MD or one of the following Advanced Practice Providers on your designated Care Team:   Murray Hodgkins, NP Christell Faith, PA-C Cadence Kathlen Mody, PA-C Gerrie Nordmann, NP   Other Instructions N/A  Important Information About Sugar

## 2022-01-11 NOTE — Progress Notes (Signed)
Cardiology Office Note   Date:  01/11/2022   ID:  Morgan Mcdowell, DOB February 19, 1981, MRN [id number]  PCP:  Dortha Kern, MD  Cardiologist:   Lorine Bears, MD   Chief Complaint  Patient presents with   Other    6 month f/u no complaints today. Meds reviewed verbally with pt.      History of Present Illness: Morgan Mcdowell is a 41 y.o. female who presents for follow-up visit regarding coronary artery disease. She has known history of tobacco use, psoriasis, latent tuberculosis and GERD. She presented in November 2022 with inferior STEMI.  Emergent cardiac catheterization showed occluded right posterior AV groove and RPL 2.  The right posterior AV groove was treated with PCI and drug-eluting stent placement.  RPL 2 was too small to stent.  Echocardiogram showed normal LV systolic function. She had been doing well with no chest pain or worsening dyspnea.  She continues to smoke less than 1 pack/day. She works as a Forensic scientist.   Past Medical History:  Diagnosis Date   CAD (coronary artery disease)    a. 02/2021 Inf STEMI/PCI: LM nl, LCX nl, OM1/2/3 nl, RCA 30p, RPAV 100 (2.5x12 Onyx Frontier DES), RPL1 nl, RPL2 100. EF 50-55%.   Ectopic pregnancy    GERD (gastroesophageal reflux disease)    OCC-TUMS PRN   History of echocardiogram    a. 02/2021 Echo: EF 60-65%, no nrwma, nl RV size/fxn, triv MR.   Hyperlipidemia LDL goal <70    Latent tuberculosis by blood test    Psoriasis    Tobacco abuse     Past Surgical History:  Procedure Laterality Date   CHOLECYSTECTOMY     CORONARY/GRAFT ACUTE MI REVASCULARIZATION N/A 03/06/2021   Procedure: Coronary/Graft Acute MI Revascularization;  Surgeon: Iran Ouch, MD;  Location: [facility] INVASIVE CV LAB;  Service: Cardiovascular;  Laterality: N/A;   DG GALL BLADDER     DIAGNOSTIC LAPAROSCOPY WITH REMOVAL OF ECTOPIC PREGNANCY N/A 12/11/2015   Procedure: DIAGNOSTIC LAPAROSCOPY WITH REMOVAL OF ECTOPIC PREGNANCY;   Surgeon: Conard Novak, MD;  Location: [facility] ORS;  Service: Gynecology;  Laterality: N/A;   DILATION AND CURETTAGE OF UTERUS     LAPAROSCOPIC BILATERAL SALPINGECTOMY Bilateral 12/11/2015   Procedure: LAPAROSCOPIC BILATERAL SALPINGECTOMY;  Surgeon: Conard Novak, MD;  Location: [facility] ORS;  Service: Gynecology;  Laterality: Bilateral;   LEFT HEART CATH AND CORONARY ANGIOGRAPHY N/A 03/06/2021   Procedure: LEFT HEART CATH AND CORONARY ANGIOGRAPHY;  Surgeon: Iran Ouch, MD;  Location: [facility] INVASIVE CV LAB;  Service: Cardiovascular;  Laterality: N/A;     Current Outpatient Medications  Medication Sig Dispense Refill   aspirin 81 MG chewable tablet Chew 1 tablet (81 mg total) by mouth daily. 90 tablet 3   atorvastatin (LIPITOR) 80 MG tablet Take 1 tablet (80 mg total) by mouth daily. 90 tablet 3   ezetimibe (ZETIA) 10 MG tablet Take 1 tablet (10 mg total) by mouth daily. 90 tablet 3   gabapentin (NEURONTIN) 100 MG capsule Take 1 capsule by mouth at bedtime as needed.     metoprolol succinate (TOPROL-XL) 25 MG 24 hr tablet Take 0.5 tablets (12.5 mg total) by mouth daily. Take with or immediately following a meal. 45 tablet 3   nitroGLYCERIN (NITROSTAT) 0.4 MG SL tablet Place 1 tablet (0.4 mg total) under the tongue every 5 (five) minutes as needed for chest pain. 25 tablet 2   prasugrel (EFFIENT) 10 MG TABS tablet Take 1  tablet (10 mg total) by mouth daily. 90 tablet 3   traMADol (ULTRAM) 50 MG tablet Take by mouth as needed.     sertraline (ZOLOFT) 50 MG tablet Take 50 mg by mouth daily. (Patient not taking: Reported on 01/11/2022)     No current facility-administered medications for this visit.    Allergies:   Patient has no known allergies.    Social History:  The patient  reports that she has been smoking cigarettes. She has a 15.00 pack-year smoking history. She has never used smokeless tobacco. She reports that she does not drink alcohol and does not use drugs.   Family History:   The patient's family history includes Healthy in her father and mother.    ROS:  Please see the history of present illness.   Otherwise, review of systems are positive for none.   All other systems are reviewed and negative.    PHYSICAL EXAM: VS:  BP 104/60 (BP Location: Left Arm, Patient Position: Sitting, Cuff Size: Normal)   Pulse 92   Ht 5\' 5"  (1.651 m)   Wt 176 lb 4 oz (79.9 kg)   SpO2 98%   BMI 29.33 kg/m  , BMI Body mass index is 29.33 kg/m. GEN: Well nourished, well developed, in no acute distress  HEENT: normal  Neck: no JVD, carotid bruits, or masses Cardiac: RRR; no murmurs, rubs, or gallops,no edema  Respiratory:  clear to auscultation bilaterally, normal work of breathing GI: soft, nontender, nondistended, + BS MS: no deformity or atrophy  Skin: warm and dry, no rash Neuro:  Strength and sensation are intact Psych: euthymic mood, full affect   EKG:  EKG is ordered today. The ekg ordered today demonstrates normal sinus rhythm with old septal and inferior infarct.   Recent Labs: 03/07/2021: Hemoglobin 11.6; Platelets 293 03/08/2021: BUN 12; Creatinine, Ser 0.50; Magnesium 2.2; Potassium 4.1; Sodium 137 06/14/2021: ALT 10    Lipid Panel    Component Value Date/Time   CHOL 117 06/14/2021 0835   TRIG 97 06/14/2021 0835   HDL 35 (L) 06/14/2021 0835   CHOLHDL 3.3 06/14/2021 0835   CHOLHDL 9.6 03/06/2021 0944   VLDL 17 03/06/2021 0944   LDLCALC 63 06/14/2021 0835      Wt Readings from Last 3 Encounters:  01/11/22 176 lb 4 oz (79.9 kg)  07/08/21 167 lb 4 oz (75.9 kg)  04/09/21 170 lb (77.1 kg)           No data to display            ASSESSMENT AND PLAN:  1.  Coronary artery disease involving native coronary arteries: Previous inferior STEMI in November 2022.  Currently with no anginal symptoms.  Continue medical therapy.  We can likely consider stopping prasugrel in the next 6 months.  Continue aspirin indefinitely.  2.  Hyperlipidemia: She  is doing very well with atorvastatin and asymptomatic.  Most recent lipid profile showed significant improvement in LDL which was down to 63.  3.  Tobacco use, I had a prolonged discussion with her about the importance of smoking cessation and she seems to be ready to quit but she needs help.  I prescribed Chantix.    Disposition:   FU with me in 6 months  Signed,  Kathlyn Sacramento, MD  01/11/2022 3:39 PM    [place]

## 2022-02-15 ENCOUNTER — Other Ambulatory Visit: Payer: Self-pay | Admitting: Cardiovascular Disease

## 2022-02-27 ENCOUNTER — Other Ambulatory Visit: Payer: Self-pay | Admitting: Medical

## 2022-03-31 ENCOUNTER — Other Ambulatory Visit: Payer: Self-pay | Admitting: Nurse Practitioner

## 2022-05-29 ENCOUNTER — Other Ambulatory Visit: Payer: Self-pay | Admitting: Nurse Practitioner

## 2022-06-30 ENCOUNTER — Other Ambulatory Visit: Payer: Self-pay | Admitting: Nurse Practitioner

## 2022-07-12 ENCOUNTER — Ambulatory Visit: Payer: BC Managed Care – PPO | Attending: Cardiovascular Disease | Admitting: Cardiovascular Disease

## 2022-07-12 ENCOUNTER — Encounter: Payer: Self-pay | Admitting: Cardiovascular Disease

## 2022-07-12 VITALS — BP 104/60 | HR 72 | Ht 65.0 in | Wt 179.2 lb

## 2022-07-12 DIAGNOSIS — E785 Hyperlipidemia, unspecified: Secondary | ICD-10-CM | POA: Diagnosis not present

## 2022-07-12 DIAGNOSIS — I2510 Atherosclerotic heart disease of native coronary artery without angina pectoris: Secondary | ICD-10-CM

## 2022-07-12 DIAGNOSIS — Z720 Tobacco use: Secondary | ICD-10-CM | POA: Diagnosis not present

## 2022-07-12 NOTE — Progress Notes (Signed)
Cardiology Office Note   Date:  07/12/2022   ID:  Morgan Mcdowell, DOB [birth year], MRN [id number]  PCP:  Lynnell Jude, MD  Cardiologist:   Kathlyn Sacramento, MD   Chief Complaint  Patient presents with   Other    6 month f/u pt would like to discuss blood thinners; if they make her menstrual cycle heavier. Meds reviewed verbally with pt.      History of Present Illness: Morgan Mcdowell is a 42 y.o. female who presents for follow-up visit regarding coronary artery disease. She has known history of tobacco use, psoriasis, latent tuberculosis and GERD. She presented in November 2022 with inferior STEMI.  Emergent cardiac catheterization showed occluded right posterior AV groove and RPL 2.  The right posterior AV groove was treated with PCI and drug-eluting stent placement.  RPL 2 was too small to stent.  Echocardiogram showed normal LV systolic function. She works as a Catering manager.  She has been doing well with no chest pain, shortness of breath or palpitations.  Unfortunately, she continues to smoke.   Past Medical History:  Diagnosis Date   CAD (coronary artery disease)    a. 02/2021 Inf STEMI/PCI: LM nl, LCX nl, OM1/2/3 nl, RCA 30p, RPAV 100 (2.5x12 Onyx Frontier DES), RPL1 nl, RPL2 100. EF 50-55%.   Ectopic pregnancy    GERD (gastroesophageal reflux disease)    OCC-TUMS PRN   History of echocardiogram    a. 02/2021 Echo: EF 60-65%, no nrwma, nl RV size/fxn, triv MR.   Hyperlipidemia LDL goal <70    Latent tuberculosis by blood test    Psoriasis    Tobacco abuse     Past Surgical History:  Procedure Laterality Date   CHOLECYSTECTOMY     CORONARY/GRAFT ACUTE MI REVASCULARIZATION N/A 03/06/2021   Procedure: Coronary/Graft Acute MI Revascularization;  Surgeon: Wellington Hampshire, MD;  Location: [facility];  Service: Cardiovascular;  Laterality: N/A;   DG GALL BLADDER     DIAGNOSTIC LAPAROSCOPY WITH REMOVAL OF ECTOPIC PREGNANCY N/A 12/11/2015    Procedure: DIAGNOSTIC LAPAROSCOPY WITH REMOVAL OF ECTOPIC PREGNANCY;  Surgeon: Will Bonnet, MD;  Location: [facility];  Service: Gynecology;  Laterality: N/A;   DILATION AND CURETTAGE OF UTERUS     LAPAROSCOPIC BILATERAL SALPINGECTOMY Bilateral 12/11/2015   Procedure: LAPAROSCOPIC BILATERAL SALPINGECTOMY;  Surgeon: Will Bonnet, MD;  Location: [facility];  Service: Gynecology;  Laterality: Bilateral;   LEFT HEART CATH AND CORONARY ANGIOGRAPHY N/A 03/06/2021   Procedure: LEFT HEART CATH AND CORONARY ANGIOGRAPHY;  Surgeon: Wellington Hampshire, MD;  Location: [facility];  Service: Cardiovascular;  Laterality: N/A;     Current Outpatient Medications  Medication Sig Dispense Refill   aspirin 81 MG chewable tablet Chew 1 tablet (81 mg total) by mouth daily. 90 tablet 3   atorvastatin (LIPITOR) 80 MG tablet TAKE 1 TABLET BY MOUTH EVERY DAY 90 tablet 3   ezetimibe (ZETIA) 10 MG tablet TAKE 1 TABLET BY MOUTH EVERY DAY 90 tablet 0   gabapentin (NEURONTIN) 100 MG capsule Take 1 capsule by mouth at bedtime as needed.     metoprolol succinate (TOPROL-XL) 25 MG 24 hr tablet TAKE 0.5 TABLETS BY MOUTH DAILY. TAKE WITH OR IMMEDIATELY FOLLOWING A MEAL. 45 tablet 0   nitroGLYCERIN (NITROSTAT) 0.4 MG SL tablet Place 1 tablet (0.4 mg total) under the tongue every 5 (five) minutes as needed for chest pain. 25 tablet 2   prasugrel (EFFIENT) 10 MG  TABS tablet TAKE 1 TABLET BY MOUTH EVERY DAY 90 tablet 3   sertraline (ZOLOFT) 50 MG tablet Take 50 mg by mouth daily.     No current facility-administered medications for this visit.    Allergies:   Patient has no known allergies.    Social History:  The patient  reports that she has been smoking cigarettes. She has a 15.00 pack-year smoking history. She has never used smokeless tobacco. She reports that she does not drink alcohol and does not use drugs.   Family History:  The patient's family history includes Healthy in her father and mother.    ROS:   Please see the history of present illness.   Otherwise, review of systems are positive for none.   All other systems are reviewed and negative.    PHYSICAL EXAM: VS:  BP 104/60 (BP Location: Left Arm, Patient Position: Sitting, Cuff Size: Normal)   Pulse 72   Ht 5\' 5"  (1.651 m)   Wt 179 lb 4 oz (81.3 kg)   SpO2 99%   BMI 29.83 kg/m  , BMI Body mass index is 29.83 kg/m. GEN: Well nourished, well developed, in no acute distress  HEENT: normal  Neck: no JVD, carotid bruits, or masses Cardiac: RRR; no murmurs, rubs, or gallops,no edema  Respiratory:  clear to auscultation bilaterally, normal work of breathing GI: soft, nontender, nondistended, + BS MS: no deformity or atrophy  Skin: warm and dry, no rash Neuro:  Strength and sensation are intact Psych: euthymic mood, full affect   EKG:  EKG is ordered today. The ekg ordered today demonstrates normal sinus rhythm with old  inferior infarct.   Recent Labs: No results found for requested labs within last 365 days.    Lipid Panel    Component Value Date/Time   CHOL 117 06/14/2021 0835   TRIG 97 06/14/2021 0835   HDL 35 (L) 06/14/2021 0835   CHOLHDL 3.3 06/14/2021 0835   CHOLHDL 9.6 03/06/2021 0944   VLDL 17 03/06/2021 0944   LDLCALC 63 06/14/2021 0835      Wt Readings from Last 3 Encounters:  07/12/22 179 lb 4 oz (81.3 kg)  01/11/22 176 lb 4 oz (79.9 kg)  07/08/21 167 lb 4 oz (75.9 kg)           No data to display            ASSESSMENT AND PLAN:  1.  Coronary artery disease involving native coronary arteries: Previous inferior STEMI in November 2022.  Currently with no anginal symptoms.  Continue medical therapy.  I discontinued prasugrel today.  Continue aspirin 81 mg daily indefinitely.  2.  Hyperlipidemia: I reviewed most recent labs done in November which showed normal liver enzymes and an LDL of 72.  Continue high-dose atorvastatin and ezetimibe.  3.  Tobacco use: I prescribed her Chantix during last  visit with she has not been able to quit smoking.  I again discussed with her the importance of smoking cessation.    Disposition:   FU with me in 6 months  Signed,  Kathlyn Sacramento, MD  07/12/2022 2:54 PM    [facility]

## 2022-07-12 NOTE — Patient Instructions (Signed)
Medication Instructions:  STOP there Effient  *If you need a refill on your cardiac medications before your next appointment, please call your pharmacy*   Lab Work: None ordered If you have labs (blood work) drawn today and your tests are completely normal, you will receive your results only by: [place] (if you have MyChart) OR A paper copy in the mail If you have any lab test that is abnormal or we need to change your treatment, we will call you to review the results.   Testing/Procedures: None ordered   Follow-Up: At [facility], you and your health needs are our priority.  As part of our continuing mission to provide you with exceptional heart care, we have created designated Provider Care Teams.  These Care Teams include your primary Cardiologist (physician) and Advanced Practice Providers (APPs -  Physician Assistants and Nurse Practitioners) who all work together to provide you with the care you need, when you need it.  We recommend signing up for the patient portal called "MyChart".  Sign up information is provided on this After Visit Summary.  MyChart is used to connect with patients for Virtual Visits (Telemedicine).  Patients are able to view lab/test results, encounter notes, upcoming appointments, etc.  Non-urgent messages can be sent to your provider as well.   To learn more about what you can do with MyChart, go to [email or web address].    Your next appointment:   12 month(s)  Provider:   You may see Kathlyn Sacramento, MD or one of the following Advanced Practice Providers on your designated Care Team:   Murray Hodgkins, NP Christell Faith, PA-C Cadence Kathlen Mody, PA-C Gerrie Nordmann, NP    Other Instructions Steps to Quit Smoking Smoking tobacco is the leading cause of preventable death. It can affect almost every organ in the body. Smoking puts you and people around you at risk for many serious, long-lasting (chronic) diseases. Quitting smoking can be  hard, but it is one of the best things that you can do for your health. It is never too late to quit. Do not give up if you cannot quit the first time. Some people need to try many times to quit. Do your best to stick to your quit plan, and talk with your doctor if you have any questions or concerns. How do I get ready to quit? Pick a date to quit. Set a date within the next 2 weeks to give you time to prepare. Write down the reasons why you are quitting. Keep this list in places where you will see it often. Tell your family, friends, and co-workers that you are quitting. Their support is important. Talk with your doctor about the choices that may help you quit. Find out if your health insurance will pay for these treatments. Know the people, places, things, and activities that make you want to smoke (triggers). Avoid them. What first steps can I take to quit smoking? Throw away all cigarettes at home, at work, and in your car. Throw away the things that you use when you smoke, such as ashtrays and lighters. Clean your car. Empty the ashtray. Clean your home, including curtains and carpets. What can I do to help me quit smoking? Talk with your doctor about taking medicines and seeing a counselor. You are more likely to succeed when you do both. If you are pregnant or breastfeeding: Talk with your doctor about counseling or other ways to quit smoking. Do not take medicine to help you quit  smoking unless your doctor tells you to. Quit right away Quit smoking completely, instead of slowly cutting back on how much you smoke over a period of time. Stopping smoking right away may be more successful than slowly quitting. Go to counseling. In-person is best if this is an option. You are more likely to quit if you go to counseling sessions regularly. Take medicine You may take medicines to help you quit. Some medicines need a prescription, and some you can buy over-the-counter. Some medicines may  contain a drug called nicotine to replace the nicotine in cigarettes. Medicines may: Help you stop having the desire to smoke (cravings). Help to stop the problems that come when you stop smoking (withdrawal symptoms). Your doctor may ask you to use: Nicotine patches, gum, or lozenges. Nicotine inhalers or sprays. Non-nicotine medicine that you take by mouth. Find resources Find resources and other ways to help you quit smoking and remain smoke-free after you quit. They include: Online chats with a Social worker. Phone quitlines. Printed Furniture conservator/restorer. Support groups or group counseling. Text messaging programs. Mobile phone apps. Use apps on your mobile phone or tablet that can help you stick to your quit plan. Examples of free services include Quit Guide from the CDC and smokefree.gov  What can I do to make it easier to quit?  Talk to your family and friends. Ask them to support and encourage you. Call a phone quitline, such as 1-800-QUIT-NOW, reach out to support groups, or work with a Social worker. Ask people who smoke to not smoke around you. Avoid places that make you want to smoke, such as: Bars. Parties. Smoke-break areas at work. Spend time with people who do not smoke. Lower the stress in your life. Stress can make you want to smoke. Try these things to lower stress: Getting regular exercise. Doing deep-breathing exercises. Doing yoga. Meditating. What benefits will I see if I quit smoking? Over time, you may have: A better sense of smell and taste. Less coughing and sore throat. A slower heart rate. Lower blood pressure. Clearer skin. Better breathing. Fewer sick days. Summary Quitting smoking can be hard, but it is one of the best things that you can do for your health. Do not give up if you cannot quit the first time. Some people need to try many times to quit. When you decide to quit smoking, make a plan to help you succeed. Quit smoking right away, not slowly  over a period of time. When you start quitting, get help and support to keep you smoke-free. This information is not intended to replace advice given to you by your health care provider. Make sure you discuss any questions you have with your health care provider. Document Revised: 04/02/2021 Document Reviewed: 04/02/2021 Elsevier Patient Education  [place].

## 2022-08-27 ENCOUNTER — Other Ambulatory Visit: Payer: Self-pay | Admitting: Cardiovascular Disease

## 2022-08-29 NOTE — Telephone Encounter (Signed)
last visit 07/12/2022--Disposition:   FU with me in 6 months  next visit-none

## 2022-09-09 ENCOUNTER — Telehealth: Payer: Self-pay | Admitting: Cardiovascular Disease

## 2022-09-09 ENCOUNTER — Telehealth: Payer: Self-pay | Admitting: Dermatopathology

## 2022-09-09 DIAGNOSIS — I2510 Atherosclerotic heart disease of native coronary artery without angina pectoris: Secondary | ICD-10-CM

## 2022-09-09 DIAGNOSIS — R42 Dizziness and giddiness: Secondary | ICD-10-CM

## 2022-09-09 NOTE — Telephone Encounter (Signed)
.  Syncope: STAT if syncope occurred within 30 minutes and pt complains of lightheadedness High Priority if episode of passing out, completely, today or in last 24 hours    Did you pass out today? No- had an episode yesterday. Seem like she was in haze and everything seemed to be going in slow motion and felt like she was going to faint    When is the last time you passed out?  Have not passed out    Has this occurred multiple times?  Only the 1 time yesterday    Did you have any symptoms prior to passing out?  no

## 2022-09-09 NOTE — Telephone Encounter (Signed)
Check CBC, basic metabolic profile and troponin. Monitor and let us know if she has any recurrent or associated symptoms.

## 2022-09-09 NOTE — Telephone Encounter (Signed)
Patient has been made aware and will come tomorrow for labs.  Patient made aware of ED precautions should new or worsening symptoms occur. Patient verbalized understanding.

## 2022-09-09 NOTE — Telephone Encounter (Signed)
I did not need this encounter, wrong provider

## 2022-09-09 NOTE — Telephone Encounter (Signed)
The patient stated that she had a dizzy spell when she was driving yesterday. The feeling subsided after 5 minutes. She did pull over until it went away. She denied chest pain, shortness of breath, diaphoresis,  and palpitations. Blood pressure after the episode was 128/91 and heart rate was 84.   She stated that she was concerned due to her past history. She denies any other episodes and stated that she feels fine today. She did work all day in a hot environment and was not sure if she was hydrated. She has been advised to be sure to stay hydrated and to reach out to PCP if this occurs again.

## 2022-09-09 NOTE — Telephone Encounter (Signed)
Left a message for the patient to call back.  

## 2022-09-09 NOTE — Telephone Encounter (Signed)
Pt returning nurses call. Call transferred 

## 2022-09-09 NOTE — Telephone Encounter (Signed)
Pt c/o Syncope: STAT if syncope occurred within 30 minutes and pt complains of lightheadedness High Priority if episode of passing out, completely, today or in last 24 hours   Did you pass out today? No- had an episode yesterday. Seem like she was in haze and everything seemed to be going in slow motion and felt like she was going to faint   When is the last time you passed out?  Have not passed out   Has this occurred multiple times?  Only the 1 time yesterday   Did you have any symptoms prior to passing out?  no s

## 2022-09-10 ENCOUNTER — Other Ambulatory Visit
Admission: RE | Admit: 2022-09-10 | Discharge: 2022-09-10 | Disposition: A | Discharge status: Home / Self Care | Payer: BC Managed Care – PPO | Attending: Cardiovascular Disease | Admitting: Cardiovascular Disease

## 2022-09-10 DIAGNOSIS — I2510 Atherosclerotic heart disease of native coronary artery without angina pectoris: Secondary | ICD-10-CM | POA: Insufficient documentation

## 2022-09-10 DIAGNOSIS — R42 Dizziness and giddiness: Secondary | ICD-10-CM | POA: Diagnosis present

## 2022-09-10 LAB — CBC
HCT: 42.1 % (ref 36.0–46.0)
Hemoglobin: 13.9 g/dL (ref 12.0–15.0)
MCH: 29.6 pg (ref 26.0–34.0)
MCHC: 33 g/dL (ref 30.0–36.0)
MCV: 89.6 fL (ref 80.0–100.0)
Platelets: 329 10*3/uL (ref 150–400)
RBC: 4.7 MIL/uL (ref 3.87–5.11)
RDW: 14 % (ref 11.5–15.5)
WBC: 13.9 10*3/uL — ABNORMAL HIGH (ref 4.0–10.5)
nRBC: 0 % (ref 0.0–0.2)

## 2022-09-10 LAB — BASIC METABOLIC PANEL
Anion gap: 8 (ref 5–15)
BUN: 11 mg/dL (ref 6–20)
CO2: 23 mmol/L (ref 22–32)
Calcium: 8.9 mg/dL (ref 8.9–10.3)
Chloride: 104 mmol/L (ref 98–111)
Creatinine, Ser: 0.56 mg/dL (ref 0.44–1.00)
GFR, Estimated: >60 mL/min (ref >60)
Glucose, Bld: 98 mg/dL (ref 70–99)
Potassium: 3.8 mmol/L (ref 3.5–5.1)
Sodium: 135 mmol/L (ref 135–145)

## 2022-09-10 LAB — TROPONIN I (HIGH SENSITIVITY): Troponin I (High Sensitivity): 3 ng/L (ref <18)

## 2022-10-02 ENCOUNTER — Other Ambulatory Visit: Payer: Self-pay | Admitting: Cardiovascular Disease

## 2023-02-24 ENCOUNTER — Other Ambulatory Visit: Payer: Self-pay | Admitting: Medical

## 2023-02-24 ENCOUNTER — Other Ambulatory Visit: Payer: Self-pay | Admitting: Cardiovascular Disease

## 2023-02-24 NOTE — Telephone Encounter (Signed)
last visit: 07/12/22  The f/u plan of 6 months as well as 12 months in last office note.  The recall is entered for 12 month f/u.  Should she be 6 or 12 month f/u?

## 2023-03-29 ENCOUNTER — Other Ambulatory Visit: Payer: Self-pay | Admitting: Cardiovascular Disease

## 2023-06-05 ENCOUNTER — Telehealth: Payer: Self-pay | Admitting: Cardiovascular Disease

## 2023-06-05 NOTE — Telephone Encounter (Signed)
 Left a message for the patient to call back to set her up with APP this week as she is past due for an office visit.

## 2023-06-05 NOTE — Telephone Encounter (Signed)
 Appointment made for 2/11 with app

## 2023-06-05 NOTE — Telephone Encounter (Signed)
 Called patient, she states she is having some chest tightness in the middle of her chest area. It does not radiate, if she takes a deep breath it does not change her tightness, patient states it feels like it is sore feeling. The tightness does worsen when she is laying down. Denies SOB, denies swelling. Patient states she just checked her blood pressure it was 112/79 HR 95. Patient has had no change to medications, did take a tums last around 1:30 AM, it did seem to improve the symptoms and she was able to go back to sleep, however she states she also took a baby aspirin . Patient has not taken any other medication this morning, advised patient to take another tums to see if the symptoms improved again. Patient aware of ED precautions with previous medical history, but aware I am sending message for review.

## 2023-06-05 NOTE — Telephone Encounter (Signed)
   Pt c/o of Chest Pain: STAT if active CP, including tightness, pressure, jaw pain, radiating pain to shoulder/upper arm/back, CP unrelieved by Nitro. Symptoms reported of SOB, nausea, vomiting, sweating.  1. Are you having CP right now? No     2. Are you experiencing any other symptoms (ex. SOB, nausea, vomiting, sweating)? No    3. Is your CP continuous or coming and going? Coming and going    4. Have you taken Nitroglycerin ? No    5. How long have you been experiencing CP? Few days    6. If NO CP at time of call then end call with telling Pt to call back or call 911 if Chest pain returns prior to return call from triage team.

## 2023-06-06 ENCOUNTER — Ambulatory Visit: Payer: 59 | Attending: Cardiology | Admitting: Cardiology

## 2023-06-06 ENCOUNTER — Encounter: Payer: Self-pay | Admitting: Cardiology

## 2023-06-06 VITALS — BP 124/74 | HR 84 | Ht 65.0 in | Wt 175.8 lb

## 2023-06-06 DIAGNOSIS — I2510 Atherosclerotic heart disease of native coronary artery without angina pectoris: Secondary | ICD-10-CM | POA: Diagnosis not present

## 2023-06-06 DIAGNOSIS — Z720 Tobacco use: Secondary | ICD-10-CM | POA: Diagnosis not present

## 2023-06-06 DIAGNOSIS — R079 Chest pain, unspecified: Secondary | ICD-10-CM | POA: Diagnosis not present

## 2023-06-06 DIAGNOSIS — E785 Hyperlipidemia, unspecified: Secondary | ICD-10-CM

## 2023-06-06 DIAGNOSIS — K219 Gastro-esophageal reflux disease without esophagitis: Secondary | ICD-10-CM

## 2023-06-06 MED ORDER — OMEPRAZOLE 20 MG PO CPDR: 20.0000 mg | DELAYED_RELEASE_CAPSULE | Freq: Every day | ORAL | 11 refills | Status: AC

## 2023-06-06 NOTE — Progress Notes (Signed)
Cardiology Office Note:  .   Date:  06/06/2023  ID:  Morgan Mcdowell, DOB [birth year], MRN [id number] PCP: Dortha Kern, MD  [facility] Providers Cardiologist:  Lorine Bears, MD    History of Present Illness: .   Morgan Mcdowell is a 43 y.o. female with a past medical history of tobacco use, psoriasis, latent tuberculosis, GERD, coronary artery disease status post inferior STEMI with stenting of the RPAV (02/2021), who is here today for follow-up of her coronary artery disease with recent complaints of chest tightness.   She previously had presented 02/2021 after developing chest pain was initially seen in urgent care where she was found to have an inferior ST elevation on EKG.  She was transported to [facility] and underwent diagnostic catheterization revealing occlusion of the RPAV and  RPL 2 otherwise nonobstructive disease.  There are RPAV was successfully treated with a drug-eluting stent.  The RPL 2 was not felt to be amendable to PCI secondary to small size as.  Her troponin peaked at 24,000.  Echocardiogram at the time revealed an LVEF of 60 to 65% without regional wall motion abnormalities and trivial mitral regurgitation.   She was last seen in clinic 07/12/2022 by Dr.Arida.  At that time she had been doing well with no chest pain, shortness of breath or palpitations.  Unfortunately she had continued to smoke at the time continue to work in [employer].  Her prasugrel was discontinued as she had concerns for heavy menstruation.  She was also prescribed Chantix during her last visit but not been able to quit smoking smoking cessation was again reiterated.  Patient called into the nurse triage line on 06/05/2023 stating that she been having chest tightness in the middle of her chest area.  It did not radiate, if she takes a deep breath did not change and felt like a sore feeling.  She said the tightness does worsen when she is lying down.  Denies any associated shortness of  breath or swelling.  She was offered a follow-up appointment as she was overdue for yearly and with her symptoms of chest tightness.  She returns to clinic today with chest discomfort that starts in the epigastric area and runs up the center of her chest and she says it feels like it is occasionally grabbing and lifting.  She has never had acid reflux before this is different from the previous symptoms that she had with her prior MI.  She states that she has had these episodes approximately 5 times since her last visit.  She denies any other associated symptoms of shortness of breath palpitations or peripheral edema.  She denies any radiation.  Denies any aggravating or alleviating symptoms.  States that she typically eats dinner around 7 or 8.  States that she has been compliant with her current medication regimen.  Denies any hospitalizations or visits to the emergency department.  ROS: 10 point review of systems has been reviewed and considered negative except what is been listed in the HPI  Studies Reviewed: Marland Kitchen   EKG Interpretation Date/Time:  Tuesday June 06 2023 15:17:43 EST Ventricular Rate:  84 PR Interval:  148 QRS Duration:  84 QT Interval:  368 QTC Calculation: 434 R Axis:   -14  Text Interpretation: Normal sinus rhythm Possible Left atrial enlargement Inferior infarct , age undetermined When compared with ECG of 06-Mar-2021 08:55, PREVIOUS ECG IS PRESENT Confirmed by Charlsie Quest ([id number]) on 06/06/2023 3:33:03 PM    2D  echo 03/07/2021 1. Left ventricular ejection fraction, by estimation, is 60 to 65%. The  left ventricle has normal function. The left ventricle has no regional  wall motion abnormalities. Left ventricular diastolic parameters were  normal.   2. Right ventricular systolic function is normal. The right ventricular  size is normal. Tricuspid regurgitation signal is inadequate for assessing  PA pressure.   3. The mitral valve is normal in structure. Trivial mitral  valve  regurgitation. No evidence of mitral stenosis.   4. The aortic valve is normal in structure. Aortic valve regurgitation is  not visualized. No aortic stenosis is present.   5. The inferior vena cava is normal in size with greater than 50%  respiratory variability, suggesting right atrial pressure of 3 mmHg.   LHC 03/06/2021   Prox RCA lesion is 30% stenosed.   RPAV lesion is 100% stenosed.   2nd RPL lesion is 100% stenosed.   A drug-eluting stent was successfully placed using a STENT ONYX FRONTIER 2.5X12.   Post intervention, there is a 0% residual stenosis.   The left ventricular systolic function is normal.   LV end diastolic pressure is moderately elevated.   The left ventricular ejection fraction is 50-55% by visual estimate.   1.  Severe one-vessel coronary artery disease with thrombotic occlusion of the proximal right posterior AV groove branch of the right coronary artery.  In addition, the second posterolateral branch is occluded with evidence of thrombus in the terminal branch.  No other obstructive disease. 2.  Low normal LV systolic function with an EF of 50 to 55% with basal inferior wall hypokinesis.  Moderately elevated left ventricular end-diastolic pressure at 25 mmHg. 3.  Successful angioplasty and drug-eluting stent placement to the right posterior AV groove artery.  PL 2 was not treated given terminal occlusion and small vessel size.   Recommendations: Dual antiplatelet therapy for at least 1 year. Aggressive treatment of risk factors. Smoking cessation is strongly advised and the patient was referred to cardiac rehab. Risk Assessment/Calculations:             Physical Exam:   VS:  BP 124/74   Pulse 84   Ht 5\' 5"  (1.651 m)   Wt 175 lb 12.8 oz (79.7 kg)   SpO2 96%   BMI 29.25 kg/m    Wt Readings from Last 3 Encounters:  06/06/23 175 lb 12.8 oz (79.7 kg)  07/12/22 179 lb 4 oz (81.3 kg)  01/11/22 176 lb 4 oz (79.9 kg)    GEN: Well nourished, well  developed in no acute distress NECK: No JVD; No carotid bruits CARDIAC: RRR, no murmurs, rubs, gallops RESPIRATORY:  Clear to auscultation without rales, wheezing or rhonchi  ABDOMEN: Soft, non-tender, non-distended EXTREMITIES:  No edema; No deformity   ASSESSMENT AND PLAN: .   Coronary artery disease involving native coronary arteries with previous inferior STEMI November 2022.  She was taken off of breath in 2024.  She was continued on aspirin 81 mg daily indefinitely.  EKG today reveals sinus rhythm with left atrial enlargement with a rate of 84 and prior inferior infarct but no acute ischemic changes.  She has discomfort that is epigastric but is concerning for chest pain that has happened with rest or exertion.  She has been scheduled for cardiac PET stress she has not had any ischemic workup since her prior visit.  She is also being scheduled for routine labs of a CBC, CMP, and lipid.  Hyperlipidemia with a last LDL  of 72 in 2023.  She is continued on Lipitor 80 mg daily and ezetimibe 10 mg daily.  She has been scheduled for an updated lipid panel.  Epigastric discomfort concerning for GERD but is not always related to meals.  She is being started on omeprazole 20 mg daily to help with acid reflux symptoms while we are waiting for further cardiac evaluation to be completed.  Tobacco abuse previously tried Chantix and was again unsuccessful.  Stressed the importance of smoking cessation.    Informed Consent   Shared Decision Making/Informed Consent The risks [chest pain, shortness of breath, cardiac arrhythmias, dizziness, blood pressure fluctuations, myocardial infarction, stroke/transient ischemic attack, nausea, vomiting, allergic reaction, radiation exposure, metallic taste sensation and life-threatening complications (estimated to be 1 in 10,000)], benefits (risk stratification, diagnosing coronary artery disease, treatment guidance) and alternatives of a cardiac PET stress test were  discussed in detail with Ms. Vinsant and she agrees to proceed.     Dispo: Patient return to clinic to see MD/APP once testing is completed or sooner if needed.  Signed, Salvatore Poe, NP

## 2023-06-06 NOTE — Patient Instructions (Signed)
Medication Instructions:  START Omeprazole 20 mg once daily  *If you need a refill on your cardiac medications before your next appointment, please call your pharmacy*   Lab Work: Your provider would like for you to return in the next few days to have the following labs drawn: CBC, CMET, Lipid.   Please go to [facility] [address] ([facility]) [address], [place] [address] You do not need an appointment.  They are open from 8 am- 4:30 pm.  Lunch from 1:00 pm- 2:00 pm You will need to be fasting.   You may also go to one of the following [facility]:  [address] [place], [place] [address] Phone: [phone] Lab hours: Mon-Fri 8 am- 5 pm    Lunch 12 pm- 1 pm  [address] [place],  [place]  [address]  [place] Phone: [phone] Lab hours: 7 am- 4 pm Lunch 12 pm-1 pm   [address] [place],  [place]  [address]  [place] Phone: [phone] Lab hours: Mon-Fri 8 am- 5 pm    Lunch 12 pm- 1 pm  If you have labs (blood work) drawn today and your tests are completely normal, you will receive your results only by: MyChart Message (if you have MyChart) OR A paper copy in the mail If you have any lab test that is abnormal or we need to change your treatment, we will call you to review the results.   Testing/Procedures: CARDIAC PET- Your physician has requested that you have a Cardiac Pet Stress Test.   This testing is completed at [facility] ([address] [place], [place] [address]) or [facility] ([address] [place], [place]). Please arrive 30 minutes prior to your scheduled time.  The schedulers will call you to get this scheduled. Please follow further testing instructions below.    Follow-Up: At [facility], you and your health needs are our priority.  As part of our continuing mission to provide you with exceptional heart care, we have created designated Provider Care Teams.  These Care Teams  include your primary Cardiologist (physician) and Advanced Practice Providers (APPs -  Physician Assistants and Nurse Practitioners) who all work together to provide you with the care you need, when you need it.  We recommend signing up for the patient portal called "MyChart".  Sign up information is provided on this After Visit Summary.  MyChart is used to connect with patients for Virtual Visits (Telemedicine).  Patients are able to view lab/test results, encounter notes, upcoming appointments, etc.  Non-urgent messages can be sent to your provider as well.   To learn more about what you can do with MyChart, go to [email or web address].    Your next appointment:   Follow up in 2 months with Dr. Kirke Corin or Charlsie Quest, NP  Other Instructions    Please report to Radiology at the [facility] Main Entrance 30 minutes early for your test.  [address] [place], [place] [address]                         OR   Please report to Radiology at [facility] Main Entrance, medical mall, 30 mins prior to your test.  [address]  [place], [place]  How to Prepare for Your Cardiac PET/CT Stress Test:  Nothing to eat or drink, except water, 3 hours prior to arrival time.  NO caffeine/decaffeinated products,  or chocolate 12 hours prior to arrival. (Please note decaffeinated beverages (teas/coffees) still contain caffeine).  If you have caffeine within 12 hours prior, the test will need to be rescheduled.  Medication instructions: Do not take nitrates (isosorbide mononitrate, Ranexa) the day before or day of test  You may take your remaining medications with water.  NO perfume, cologne or lotion on chest or abdomen area. FEMALES - Please avoid wearing dresses to this appointment.  Total time is 1 to 2 hours; you may want to bring reading material for the waiting time.  IF YOU THINK YOU MAY BE PREGNANT, OR ARE NURSING PLEASE INFORM THE TECHNOLOGIST.  In  preparation for your appointment, medication and supplies will be purchased.  Appointment availability is limited, so if you need to cancel or reschedule, please call the Radiology Department Scheduler at [phone] 24 hours in advance to avoid a cancellation fee of $100.00  What to Expect When you Arrive:  Once you arrive and check in for your appointment, you will be taken to a preparation room within the Radiology Department.  A technologist or Nurse will obtain your medical history, verify that you are correctly prepped for the exam, and explain the procedure.  Afterwards, an IV will be started in your arm and electrodes will be placed on your skin for EKG monitoring during the stress portion of the exam. Then you will be escorted to the PET/CT scanner.  There, staff will get you positioned on the scanner and obtain a blood pressure and EKG.  During the exam, you will continue to be connected to the EKG and blood pressure machines.  A small, safe amount of a radioactive tracer will be injected in your IV to obtain a series of pictures of your heart along with an injection of a stress agent.    After your Exam:  It is recommended that you eat a meal and drink a caffeinated beverage to counter act any effects of the stress agent.  Drink plenty of fluids for the remainder of the day and urinate frequently for the first couple of hours after the exam.  Your doctor will inform you of your test results within 7-10 business days.  For more information and frequently asked questions, please visit our website: [email or web address]  For questions about your test or how to prepare for your test, please call: Cardiac Imaging Nurse Navigators Office: [phone]

## 2023-07-04 ENCOUNTER — Encounter (HOSPITAL_COMMUNITY): Payer: Self-pay

## 2023-07-06 ENCOUNTER — Ambulatory Visit: Admission: RE | Admit: 2023-07-06 | Payer: 59 | Source: Ambulatory Visit

## 2023-07-20 ENCOUNTER — Other Ambulatory Visit: Payer: 59

## 2023-07-24 ENCOUNTER — Telehealth: Payer: Self-pay | Admitting: Cardiology

## 2023-07-24 NOTE — Telephone Encounter (Signed)
 Called and spoke with patient. Asked patient to have her dentist fax over a clearance form. Patient verbalizes understanding.

## 2023-07-24 NOTE — Telephone Encounter (Signed)
   Pre-operative Risk Assessment    Patient Name: Morgan Mcdowell  DOB: June 10, 1980 MRN: [id number]   Date of last office visit: 06-06-23 Date of next office visit: 08-04-23  Request for Surgical Clearance    Procedure:  Dental Extraction - Amount of Teeth to be Pulled:  1  Date of Surgery:  Clearance TBD                                Surgeon:  Harless Nakayama Surgeon's Group or Practice Name:  [facility] Phone number:  [phone] Fax number:  [phone]   Type of Clearance Requested:   - Pharmacy:  Hold    not indicated   Type of Anesthesia:  Not Indicated   Additional requests/questions:    SignedShawna Orleans   07/24/2023, 11:24 AM

## 2023-07-24 NOTE — Telephone Encounter (Signed)
 New Message:      Patient says she is going to go to the dentist to get her tooth pulled. She wants to know if she needs to stop any of her medicine?

## 2023-07-24 NOTE — Telephone Encounter (Signed)
    Primary Cardiologist: Lorine Bears, MD  Chart reviewed as part of pre-operative protocol coverage. Simple dental extractions are considered low risk procedures per guidelines and generally do not require any specific cardiac clearance. It is also generally accepted that for simple extractions and dental cleanings, there is no need to interrupt blood thinner therapy.   SBE prophylaxis is not required for the patient.  I will route this recommendation to the requesting party via Epic fax function and remove from pre-op pool.  Please call with questions.  Sharlene Dory, PA-C 07/24/2023, 11:56 AM

## 2023-08-01 ENCOUNTER — Encounter (HOSPITAL_COMMUNITY): Payer: Self-pay

## 2023-08-02 ENCOUNTER — Telehealth (HOSPITAL_COMMUNITY): Payer: Self-pay | Admitting: *Deleted

## 2023-08-02 NOTE — Telephone Encounter (Signed)
Attempted to call patient regarding upcoming cardiac PET appointment. Left message on voicemail with name and callback number  Monya Kozakiewicz RN Navigator Cardiac Imaging [facility] [phone] Office [phone] Cell  Reminder to avoid caffeine 12 hours prior to appt. 

## 2023-08-03 ENCOUNTER — Ambulatory Visit: Admission: RE | Admit: 2023-08-03 | Source: Ambulatory Visit

## 2023-08-04 ENCOUNTER — Ambulatory Visit: Payer: 59 | Admitting: Cardiology

## 2023-08-22 ENCOUNTER — Other Ambulatory Visit: Payer: Self-pay | Admitting: Cardiovascular Disease

## 2023-08-22 NOTE — Telephone Encounter (Signed)
 Please schedule appointment with Dr Alvenia Aus overdue follow up

## 2023-08-22 NOTE — Telephone Encounter (Signed)
LVM to schedule appt. Please schedule.

## 2023-09-19 ENCOUNTER — Other Ambulatory Visit: Payer: Self-pay | Admitting: Cardiovascular Disease

## 2024-01-01 ENCOUNTER — Ambulatory Visit: Attending: Cardiology | Admitting: Cardiology

## 2024-01-01 ENCOUNTER — Encounter: Payer: Self-pay | Admitting: Cardiology

## 2024-01-01 VITALS — BP 126/70 | HR 75 | Ht 65.0 in | Wt 178.8 lb

## 2024-01-01 DIAGNOSIS — Z79899 Other long term (current) drug therapy: Secondary | ICD-10-CM | POA: Diagnosis not present

## 2024-01-01 DIAGNOSIS — K219 Gastro-esophageal reflux disease without esophagitis: Secondary | ICD-10-CM

## 2024-01-01 DIAGNOSIS — Z720 Tobacco use: Secondary | ICD-10-CM

## 2024-01-01 DIAGNOSIS — I2510 Atherosclerotic heart disease of native coronary artery without angina pectoris: Secondary | ICD-10-CM

## 2024-01-01 DIAGNOSIS — E785 Hyperlipidemia, unspecified: Secondary | ICD-10-CM

## 2024-01-01 DIAGNOSIS — R072 Precordial pain: Secondary | ICD-10-CM

## 2024-01-01 MED ORDER — VARENICLINE TARTRATE 1 MG PO TABS: 1.0000 mg | ORAL_TABLET | Freq: Two times a day (BID) | ORAL | 3 refills | Status: AC

## 2024-01-01 NOTE — Progress Notes (Signed)
 Cardiology Office Note   Date:  01/01/2024  ID:  Morgan Mcdowell, DOB 1980/07/12, MRN [id number] PCP: Derick Leita POUR, MD  [facility] Providers Cardiologist:  Deatrice Cage, MD     History of Present Illness Morgan Mcdowell is a 43 y.o. female with a past medical history of tobacco use, psoriasis, latent tuberculosis, GERD, coronary artery disease status post superior STEMI with stenting to the RP AV (02/2021), is here today for follow-up.   She previously had presented 02/2021 after developing chest pain was initially seen in urgent care where she was found to have an inferior ST elevation on EKG.  She was transported to [facility] and underwent diagnostic catheterization revealing occlusion of the RPAV and  RPL 2 otherwise nonobstructive disease.  There are RPAV was successfully treated with a drug-eluting stent.  The RPL 2 was not felt to be amendable to PCI secondary to small size as.  Her troponin peaked at 24,000.  Echocardiogram at the time revealed an LVEF of 60 to 65% without regional wall motion abnormalities and trivial mitral regurgitation.  She was seen in clinic 07/12/2022 by Dr.Arida.  At that time she had been doing well with no chest pain, shortness of breath or palpitations.  Unfortunately continues smoke at the time continue to work in [employer].  Prasugrel  was discontinued as she had concerns for heavy menstruation.  She was also prescribed Chantix  during that visit and had not been able to quit smoking.  She called the nurse triage line 06/05/2023 stating she was having chest tightness in the middle of her chest.  It did not radiate.  She takes a deep breath that did not change for like a sore feeling.  She said the tightness does worsen when she is lying down.   She was last seen in clinic 06/06/2023 with chest discomfort that started the epigastric area and went to the center of the chest.  She never had acid reflux before and it was different from previous  symptoms that she had with a prior MI.  She stated she had these episodes approximately 5 times since her last visit.  With her history of coronary artery disease and worsening symptoms she was scheduled for coronary PET stress.  She returns clinic today with complaints of chest discomfort that she still describes as epigastric that runs up the center of her chest and feels like it is occasionally grabbing feeling.  She stated originally it had improved with PPI therapy but now is with exertion and relieved with rest.  It is short-lived and she has not taking any nitro.  Previously she was scheduled for coronary PET stress but the cost of the test with her insurance was more than she can handle so she has had no testing.  She states that she has been compliant with her current medication regimen without any undue side effects.  She denies any hospitalizations or visits to the emergency department.  ROS: 10 point review of systems has been reviewed and considered negative the exception was been listed in the HPI  Studies Reviewed EKG Interpretation Date/Time:  Monday January 01 2024 15:31:55 EDT Ventricular Rate:  75 PR Interval:  154 QRS Duration:  86 QT Interval:  384 QTC Calculation: 428 R Axis:   -12  Text Interpretation: Normal sinus rhythm Possible Left atrial enlargement Inferior infarct (cited on or before 06-Jun-2023) When compared with ECG of 06-Jun-2023 15:17, No significant change was found Confirmed by Morgan Mcdowell ([id number]) on 01/01/2024  4:02:56 PM    2D echo 03/07/2021 1. Left ventricular ejection fraction, by estimation, is 60 to 65%. The  left ventricle has normal function. The left ventricle has no regional  wall motion abnormalities. Left ventricular diastolic parameters were  normal.   2. Right ventricular systolic function is normal. The right ventricular  size is normal. Tricuspid regurgitation signal is inadequate for assessing  PA pressure.   3. The mitral valve is  normal in structure. Trivial mitral valve  regurgitation. No evidence of mitral stenosis.   4. The aortic valve is normal in structure. Aortic valve regurgitation is  not visualized. No aortic stenosis is present.   5. The inferior vena cava is normal in size with greater than 50%  respiratory variability, suggesting right atrial pressure of 3 mmHg.    LHC 03/06/2021   Prox RCA lesion is 30% stenosed.   RPAV lesion is 100% stenosed.   2nd RPL lesion is 100% stenosed.   A drug-eluting stent was successfully placed using a STENT ONYX FRONTIER 2.5X12.   Post intervention, there is a 0% residual stenosis.   The left ventricular systolic function is normal.   LV end diastolic pressure is moderately elevated.   The left ventricular ejection fraction is 50-55% by visual estimate.   1.  Severe one-vessel coronary artery disease with thrombotic occlusion of the proximal right posterior AV groove branch of the right coronary artery.  In addition, the second posterolateral branch is occluded with evidence of thrombus in the terminal branch.  No other obstructive disease. 2.  Low normal LV systolic function with an EF of 50 to 55% with basal inferior wall hypokinesis.  Moderately elevated left ventricular end-diastolic pressure at 25 mmHg. 3.  Successful angioplasty and drug-eluting stent placement to the right posterior AV groove artery.  PL 2 was not treated given terminal occlusion and small vessel size.   Recommendations: Dual antiplatelet therapy for at least 1 year. Aggressive treatment of risk factors. Smoking cessation is strongly advised and the patient was referred to cardiac rehab. Risk Assessment/Calculations           Physical Exam VS:  BP 126/70   Pulse 75   Ht 5' 5 (1.651 m)   Wt 178 lb 12.8 oz (81.1 kg)   SpO2 98%   BMI 29.75 kg/m        Wt Readings from Last 3 Encounters:  01/01/24 178 lb 12.8 oz (81.1 kg)  06/06/23 175 lb 12.8 oz (79.7 kg)  07/12/22 179 lb 4 oz (81.3  kg)    GEN: Well nourished, well developed in no acute distress NECK: No JVD; No carotid bruits CARDIAC: RRR, no murmurs, rubs, gallops RESPIRATORY:  Clear to auscultation without rales, wheezing or rhonchi  ABDOMEN: Soft, non-tender, non-distended EXTREMITIES:  No edema; No deformity   ASSESSMENT AND PLAN Coronary artery disease involving native coronary arteries with previous inferior STEMI in November 2022.  She continues to have atypical and typical chest discomfort previously was scheduled for cardiac PET stress but due to insurance issues was unable to afford testing.  Testing has been changed to a [employer] as she previously had stent placement from prior MI.  EKG today reveals sinus rhythm with rate of 75, left atrial enlargement, old inferior infarct, with no significant changes.  She has been continued on aspirin  81 mg daily, atorvastatin  80 mg daily and ezetimibe  10 mg daily.  She is being sent for updated labs today of BMP, CBC and direct LDL  she did not have her labs completed at last appointment.  Hyperlipidemia with last LDL 72 in 2023.  She is continued on Lipitor 80 and ezetimibe  10 mg daily she has been scheduled for an updated direct LDL which she has eaten today.  Epigastric discomfort sounding like typical versus atypical chest discomfort.  She has been continued on omeprazole  20 mg daily to help with her acid reflux.  Tobacco abuse with total cessation being recommended.  She has requested to restart Chantix  again.    Informed Consent   Shared Decision Making/Informed Consent The risks [chest pain, shortness of breath, cardiac arrhythmias, dizziness, blood pressure fluctuations, myocardial infarction, stroke/transient ischemic attack, nausea, vomiting, allergic reaction, radiation exposure, metallic taste sensation and life-threatening complications (estimated to be 1 in 10,000)], benefits (risk stratification, diagnosing coronary artery disease, treatment guidance)  and alternatives of a nuclear stress test were discussed in detail with Ms. Pae and she agrees to proceed.     Dispo: Patient to return to clinic to see MD/APP in 6 weeks or sooner if needed for further evaluation after testing is completed.  Signed, Alahni Varone, NP

## 2024-01-01 NOTE — Patient Instructions (Signed)
 Medication Instructions:  Your physician recommends the following medication changes.  START TAKING: Chantix   *If you need a refill on your cardiac medications before your next appointment, please call your pharmacy*  Lab Work: Your provider would like for you to have following labs drawn today BMP, CBC, Direct LDL,  If you have labs (blood work) drawn today and your tests are completely normal, you will receive your results only by: MyChart Message (if you have MyChart) OR A paper copy in the mail If you have any lab test that is abnormal or we need to change your treatment, we will call you to review the results.  Testing/Procedures Your provider has ordered a Lexiscan/ Exercise Myoview Stress test. This will take place at [facility]. Please report to the [facility] medical [facility] entrance. The volunteers at the first desk will direct you where to go.  [facility] MYOVIEW  Your provider has ordered a Stress Test with nuclear imaging. The purpose of this test is to evaluate the blood supply to your heart muscle. This procedure is referred to as a Non-Invasive Stress Test. This is because other than having an IV started in your vein, nothing is inserted or invades your body. Cardiac stress tests are done to find areas of poor blood flow to the heart by determining the extent of coronary artery disease (CAD). Some patients exercise on a treadmill, which naturally increases the blood flow to your heart, while others who are unable to walk on a treadmill due to physical limitations will have a pharmacologic/chemical stress agent called Lexiscan . This medicine will mimic walking on a treadmill by temporarily increasing your coronary blood flow.   Please note: these test may take anywhere between 2-4 hours to complete  How to prepare for your Myoview test:  Nothing to eat for 6 hours prior to the test No caffeine for 24 hours prior to test No smoking 24 hours prior to test. Your medication may be taken with  water.  If your doctor stopped a medication because of this test, do not take that medication. Ladies, please do not wear dresses.  Skirts or pants are appropriate. Please wear a short sleeve shirt. No perfume, cologne or lotion. Wear comfortable walking shoes. No heels!   PLEASE NOTIFY THE OFFICE AT LEAST 24 HOURS IN ADVANCE IF YOU ARE UNABLE TO KEEP YOUR APPOINTMENT.  [phone] AND  PLEASE NOTIFY NUCLEAR MEDICINE AT [facility] AT LEAST 24 HOURS IN ADVANCE IF YOU ARE UNABLE TO KEEP YOUR APPOINTMENT. [phone]   Follow-Up: At [facility], you and your health needs are our priority.  As part of our continuing mission to provide you with exceptional heart care, our providers are all part of one team.  This team includes your primary Cardiologist (physician) and Advanced Practice Providers or APPs (Physician Assistants and Nurse Practitioners) who all work together to provide you with the care you need, when you need it.  Your next appointment:   6 week(s)  Provider:   You may see Deatrice Cage, MD or one of the following Advanced Practice Providers on your designated Care Team:   Lonni Meager, NP Lesley Maffucci, PA-C Bernardino Bring, PA-C Cadence Quinwood, PA-C Tylene Lunch, NP Barnie Hila, NP

## 2024-01-03 ENCOUNTER — Ambulatory Visit: Payer: Self-pay | Admitting: Cardiology

## 2024-01-03 NOTE — Progress Notes (Signed)
 Kidney function and electrolytes have remained stable.  White blood count remains elevated which has been chronically elevated over the last several years no other significant changes noted.  LDL to follow.

## 2024-01-04 LAB — BASIC METABOLIC PANEL WITH GFR
BUN/Creatinine Ratio: 17 (ref 9–23)
BUN: 9 mg/dL (ref 6–24)
CO2: 16 mmol/L — ABNORMAL LOW (ref 20–29)
Calcium: 9.8 mg/dL (ref 8.7–10.2)
Chloride: 102 mmol/L (ref 96–106)
Creatinine, Ser: 0.54 mg/dL — ABNORMAL LOW (ref 0.57–1.00)
Glucose: 97 mg/dL (ref 70–99)
Potassium: 4.8 mmol/L (ref 3.5–5.2)
Sodium: 136 mmol/L (ref 134–144)
eGFR: 117 mL/min/1.73 (ref >59)

## 2024-01-04 LAB — CBC
Hematocrit: 39.7 % (ref 34.0–46.6)
Hemoglobin: 13.1 g/dL (ref 11.1–15.9)
MCH: 30.6 pg (ref 26.6–33.0)
MCHC: 33 g/dL (ref 31.5–35.7)
MCV: 93 fL (ref 79–97)
Platelets: 314 x10E3/uL (ref 150–450)
RBC: 4.28 x10E6/uL (ref 3.77–5.28)
RDW: 14 % (ref 11.7–15.4)
WBC: 16.8 x10E3/uL — ABNORMAL HIGH (ref 3.4–10.8)

## 2024-01-04 LAB — LDL CHOLESTEROL, DIRECT

## 2024-01-10 ENCOUNTER — Encounter
Admission: RE | Admit: 2024-01-10 | Discharge: 2024-01-10 | Disposition: A | Discharge status: Home / Self Care | Source: Ambulatory Visit | Attending: Cardiology | Admitting: Cardiology

## 2024-01-10 ENCOUNTER — Other Ambulatory Visit: Payer: Self-pay | Admitting: Physician Assistant

## 2024-01-10 DIAGNOSIS — R072 Precordial pain: Secondary | ICD-10-CM | POA: Diagnosis not present

## 2024-01-10 MED ORDER — TECHNETIUM TC 99M TETROFOSMIN IV KIT
10.0000 | PACK | Freq: Once | INTRAVENOUS | Status: AC | PRN
  Administered 2024-01-10: 10.23 via INTRAVENOUS

## 2024-01-10 MED ORDER — TECHNETIUM TC 99M TETROFOSMIN IV KIT
31.6100 | PACK | Freq: Once | INTRAVENOUS | Status: AC | PRN
  Administered 2024-01-10: 31.61 via INTRAVENOUS

## 2024-01-10 MED ORDER — REGADENOSON 0.4 MG/5ML IV SOLN
0.4000 mg | Freq: Once | INTRAVENOUS | Status: AC
  Administered 2024-01-10: 0.4 mg via INTRAVENOUS

## 2024-01-10 NOTE — Progress Notes (Signed)
     Morgan Mcdowell presented for a nuclear stress test today.  I Lesley LITTIE Maffucci, PA-C, provided direct supervision and was present during the stress portion of the study today, which was completed without significant symptoms, immediate complications, or acute ST/T changes on ECG.  Stress imaging is pending at this time.  Preliminary ECG findings may be listed in the chart, but the stress test result will not be finalized until perfusion imaging is complete.  Lesley LITTIE Maffucci, PA-C  01/10/2024, 10:16 AM

## 2024-01-11 LAB — NM MYOCAR MULTI W/SPECT W/WALL MOTION / EF
LV dias vol: 114 mL (ref 46–106)
LV sys vol: 54 mL (ref 3.8–5.2)
MPHR: 177 {beats}/min
Nuc Stress EF: 53 %
Peak HR: 129 {beats}/min
Percent HR: 72 %
Rest HR: 67 {beats}/min
Rest Nuclear Isotope Dose: 10.2 mCi
SDS: 0
SRS: 3
SSS: 1
ST Depression (mm): 0 mm
Stress Nuclear Isotope Dose: 31.6 mCi
TID: 0.75

## 2024-01-12 NOTE — Progress Notes (Signed)
 No significant ischemia, normal wall motion, no EKG changes concerning for ischemia at peak stress or in recovery.  CT attenuation correction images with a stent in the distal RCA with no significant aortic atherosclerosis.  Overall considered low risk scan.

## 2024-02-16 ENCOUNTER — Ambulatory Visit: Attending: Cardiology | Admitting: Cardiology

## 2024-02-16 ENCOUNTER — Encounter: Payer: Self-pay | Admitting: Cardiology

## 2024-02-16 VITALS — BP 120/78 | HR 96 | Ht 65.0 in | Wt 187.2 lb

## 2024-02-16 DIAGNOSIS — Z720 Tobacco use: Secondary | ICD-10-CM

## 2024-02-16 DIAGNOSIS — K219 Gastro-esophageal reflux disease without esophagitis: Secondary | ICD-10-CM

## 2024-02-16 DIAGNOSIS — E785 Hyperlipidemia, unspecified: Secondary | ICD-10-CM | POA: Diagnosis not present

## 2024-02-16 DIAGNOSIS — I2510 Atherosclerotic heart disease of native coronary artery without angina pectoris: Secondary | ICD-10-CM | POA: Diagnosis not present

## 2024-02-16 NOTE — Progress Notes (Signed)
 Cardiology Office Note   Date:  02/16/2024  ID:  Morgan Mcdowell, DOB May 29, 1980, MRN [id number] PCP: Derick Leita POUR, MD  [facility] Providers Cardiologist:  Deatrice Cage, MD Cardiology APP:  Gerard Frederick, NP     History of Present Illness Morgan Mcdowell is a 43 y.o. female with past medical history of tobacco use, psoriasis, latent tuberculosis, GERD, coronary artery disease status post inferior STEMI with stenting to the RPAV (02/2021), who is here today for follow-up.   She previously had presented 02/2021 after developing chest pain was initially seen in urgent care where she was found to have an inferior ST elevation on EKG.  She was transported to [facility] and underwent diagnostic catheterization revealing occlusion of the RPAV and  RPL 2 otherwise nonobstructive disease.  There are RPAV was successfully treated with a drug-eluting stent.  The RPL 2 was not felt to be amendable to PCI secondary to small size as.  Her troponin peaked at 24,000.  Echocardiogram at the time revealed an LVEF of 60 to 65% without regional wall motion abnormalities and trivial mitral regurgitation.  She was seen in clinic 07/12/2022 by Dr.Arida.  At that time she had been doing well with no chest pain, shortness of breath or palpitations.  Unfortunately continues smoke at the time continue to work in [employer].  Prasugrel  was discontinued as she had concerns for heavy menstruation.  She was also prescribed Chantix  during that visit and had not been able to quit smoking.  She called the nurse triage line 06/05/2023 stating she was having chest tightness in the middle of her chest.  It did not radiate.  She takes a deep breath that did not change for like a sore feeling.  She said the tightness does worsen when she is lying down.  Previously seen in clinic 06/05/2018, chest discomfort that started in the epigastric area and went to the center of her chest.  She never had acid reflux before and is  different from previous symptoms she had with her prior MI.  She stated these episodes had happened approximately 5 times since her last visit.  With her history of coronary artery disease worsening symptoms she was scheduled for cardiac PET stress.   She was last seen in clinic 01/02/2019 follow-up with complaints of chest discomfort that she still describes as epigastric that runs up the center of her chest and feels like it is occasionally grabbing feeling.  She stated originally had improved PPI therapy but now with exertion and relieved with rest.  It was short-lived and she was not taking any nitro.  Previously she was scheduled for cardiac PET stress but the cost of the test with her insurance was more than she can handle so she had declined testing.  Due to the cost of her cardiac PET stress being said she was scheduled for Lexiscan  Myoview  and was sent over for labs.   She returns to clinic today accompanied by her husband.  And states that she has been doing well from a cardiac perspective.  Has not had any recurrent chest discomfort or shortness of breath.  States that she has been compliant with her current medication regimen.  She denies any recurrent hospitalization or visits to the emergency department.  She continues to smoke and is not taking her Chantix  as previously prescribed.  ROS: 10 point review of systems has been reviewed and considered negative with exception was been listed in the HPI  Studies Reviewed  Lexiscan  MPI 01/10/2024 Pharmacological myocardial perfusion imaging study with no significant  ischemia Normal wall motion, EF estimated at 53% (GI uptake artifact noted) No EKG changes concerning for ischemia at peak stress or in recovery. CT attenuation correction images with stent in the distal RCA, no significant aortic atherosclerosis Low risk scan  2D echo 03/07/2021 1. Left ventricular ejection fraction, by estimation, is 60 to 65%. The  left ventricle has normal  function. The left ventricle has no regional  wall motion abnormalities. Left ventricular diastolic parameters were  normal.   2. Right ventricular systolic function is normal. The right ventricular  size is normal. Tricuspid regurgitation signal is inadequate for assessing  PA pressure.   3. The mitral valve is normal in structure. Trivial mitral valve  regurgitation. No evidence of mitral stenosis.   4. The aortic valve is normal in structure. Aortic valve regurgitation is  not visualized. No aortic stenosis is present.   5. The inferior vena cava is normal in size with greater than 50%  respiratory variability, suggesting right atrial pressure of 3 mmHg.    LHC 03/06/2021   Prox RCA lesion is 30% stenosed.   RPAV lesion is 100% stenosed.   2nd RPL lesion is 100% stenosed.   A drug-eluting stent was successfully placed using a STENT ONYX FRONTIER 2.5X12.   Post intervention, there is a 0% residual stenosis.   The left ventricular systolic function is normal.   LV end diastolic pressure is moderately elevated.   The left ventricular ejection fraction is 50-55% by visual estimate.   1.  Severe one-vessel coronary artery disease with thrombotic occlusion of the proximal right posterior AV groove branch of the right coronary artery.  In addition, the second posterolateral branch is occluded with evidence of thrombus in the terminal branch.  No other obstructive disease. 2.  Low normal LV systolic function with an EF of 50 to 55% with basal inferior wall hypokinesis.  Moderately elevated left ventricular end-diastolic pressure at 25 mmHg. 3.  Successful angioplasty and drug-eluting stent placement to the right posterior AV groove artery.  PL 2 was not treated given terminal occlusion and small vessel size.   Recommendations: Dual antiplatelet therapy for at least 1 year. Aggressive treatment of risk factors. Smoking cessation is strongly advised and the patient was referred to cardiac  rehab.  Risk Assessment/Calculations           Physical Exam VS:  BP 120/78 (BP Location: Left Arm, Patient Position: Sitting)   Pulse 96   Ht 5' 5 (1.651 m)   Wt 187 lb 3.2 oz (84.9 kg)   SpO2 97%   BMI 31.15 kg/m        Wt Readings from Last 3 Encounters:  02/16/24 187 lb 3.2 oz (84.9 kg)  01/01/24 178 lb 12.8 oz (81.1 kg)  06/06/23 175 lb 12.8 oz (79.7 kg)    GEN: Well nourished, well developed in no acute distress NECK: No JVD; No carotid bruits CARDIAC: RRR, no murmurs, rubs, gallops RESPIRATORY:  Clear to auscultation without rales, wheezing or rhonchi  ABDOMEN: Soft, non-tender, non-distended EXTREMITIES:  No edema; No deformity   ASSESSMENT AND PLAN Coronary artery disease of the native coronary arteries with previous inferior STEMI in 2022.  Where she was previously having typical and atypical chest discomfort will schedule for Lexiscan  Myoview  that was a low risk scan.  She has been continued on aspirin  81 mg daily atorvastatin  80 mg daily ezetimibe  10 mg daily.  Prior  labs were unrevealing.  She denies any recurrent chest discomfort or shortness of breath today.  No further ischemic evaluation at this time.  Hyperlipidemia with a goal of 70 or less with prior direct LDL being canceled by the lab.  She has been scheduled for an updated lipid panel.  She is currently continued on atorvastatin  80 mg daily and ezetimibe  10 mg daily.  Gastroesophageal reflux where she is continued on Prilosec 20 mg daily ongoing management per PCP.  Tobacco abuse with total cessation being recommended where she is continued on Chantix .        Dispo: Patient to return to clinic with MD/APP in 3 months or sooner if needed  Signed, Callia Swim, NP

## 2024-02-16 NOTE — Patient Instructions (Signed)
 Medication Instructions:  Your physician recommends that you continue on your current medications as directed. Please refer to the Current Medication list given to you today.   *If you need a refill on your cardiac medications before your next appointment, please call your pharmacy*  Lab Work: Your provider would like for you to return in next week to have the following labs drawn: LDL.   Please go to [facility] [address] ([facility] [address], [place] [address] You do not need an appointment.  They are open from 8 am- 4:30 pm.  Lunch from 1:00 pm- 2:00 pm You do not need to be fasting.   If you have labs (blood work) drawn today and your tests are completely normal, you will receive your results only by: MyChart Message (if you have MyChart) OR A paper copy in the mail If you have any lab test that is abnormal or we need to change your treatment, we will call you to review the results.  Testing/Procedures: No test ordered today   Follow-Up: At [facility], you and your health needs are our priority.  As part of our continuing mission to provide you with exceptional heart care, our providers are all part of one team.  This team includes your primary Cardiologist (physician) and Advanced Practice Providers or APPs (Physician Assistants and Nurse Practitioners) who all work together to provide you with the care you need, when you need it.  Your next appointment:   3 month(s)  Provider:   You may see Deatrice Cage, MD or one of the following Advanced Practice Providers on your designated Care Team:   Tylene Lunch, NP

## 2024-02-19 ENCOUNTER — Other Ambulatory Visit: Payer: Self-pay | Admitting: Cardiovascular Disease

## 2024-03-16 ENCOUNTER — Other Ambulatory Visit: Payer: Self-pay | Admitting: Cardiovascular Disease

## 2024-04-21 ENCOUNTER — Other Ambulatory Visit: Payer: Self-pay | Admitting: Cardiovascular Disease

## 2024-05-19 ENCOUNTER — Other Ambulatory Visit: Payer: Self-pay | Admitting: Cardiovascular Disease

## 2024-05-21 ENCOUNTER — Ambulatory Visit: Admitting: Cardiovascular Disease

## 2024-05-21 ENCOUNTER — Other Ambulatory Visit: Payer: Self-pay | Admitting: Cardiovascular Disease

## 2024-05-22 NOTE — Telephone Encounter (Signed)
 Please contact pt for future appointment. Pt due for follow up.

## 2024-05-29 NOTE — Telephone Encounter (Signed)
 Left vm
